# Patient Record
Sex: Female | Born: 1985 | Race: White | Hispanic: Yes | Marital: Married | Smoking: Never smoker
Health system: Southern US, Community
[De-identification: ages and names within clinical notes are randomized; demographics above are authoritative.]

## PROBLEM LIST (undated history)

## (undated) ENCOUNTER — Inpatient Hospital Stay (HOSPITAL_COMMUNITY): Payer: Self-pay

## (undated) DIAGNOSIS — O24419 Gestational diabetes mellitus in pregnancy, unspecified control: Secondary | ICD-10-CM

## (undated) HISTORY — PX: HAND SURGERY: SHX662

## (undated) HISTORY — DX: Gestational diabetes mellitus in pregnancy, unspecified control: O24.419

---

## 2008-11-11 ENCOUNTER — Emergency Department (HOSPITAL_COMMUNITY): Admission: EM | Admit: 2008-11-11 | Discharge: 2008-11-11 | Payer: Self-pay | Admitting: Emergency Medicine

## 2010-08-07 ENCOUNTER — Inpatient Hospital Stay (INDEPENDENT_AMBULATORY_CARE_PROVIDER_SITE_OTHER)
Admission: RE | Admit: 2010-08-07 | Discharge: 2010-08-07 | Disposition: A | Discharge status: Home / Self Care | Payer: Worker's Compensation | Source: Ambulatory Visit | Attending: Family Medicine | Admitting: Family Medicine

## 2010-08-07 DIAGNOSIS — T148XXA Other injury of unspecified body region, initial encounter: Secondary | ICD-10-CM

## 2014-03-12 NOTE — L&D Delivery Note (Signed)
Delivery Note Just prior to birth, her water broke and she immediately felt pressure, Head was crowning when I entered room (was in another delivery).  At 2:55 PM a viable and healthy female was delivered via Vaginal, Spontaneous Delivery (Presentation: ;  ).  Body delivered rapidly.  No difficulty with shoulders. APGAR: 9, 9; weight  .   Placenta status: Intact, Spontaneous.  Cord: 3 vessels with the following complications: None.    Dr Debroah LoopArnold arrived for delivery of placenta and suturing of laceration.  Anesthesia: Local  Episiotomy:  none Lacerations: 2nd degree Suture Repair: 3.0 vicryl repaired by Dr Delanna AhmadiArnold Est. Blood Loss (mL): 700  Mom to postpartum.  Baby to Couplet care / Skin to Skin.  [facility]ILLI[facility],Elizabeth [name] 02/25/2015, 3:55 PM

## 2014-05-22 ENCOUNTER — Emergency Department (INDEPENDENT_AMBULATORY_CARE_PROVIDER_SITE_OTHER)
Admission: EM | Admit: 2014-05-22 | Discharge: 2014-05-22 | Disposition: A | Discharge status: Home / Self Care | Payer: Worker's Compensation | Source: Home / Self Care | Attending: Family Medicine | Admitting: Family Medicine

## 2014-05-22 ENCOUNTER — Encounter (HOSPITAL_COMMUNITY): Payer: Self-pay | Admitting: Emergency Medicine

## 2014-05-22 DIAGNOSIS — M545 Low back pain, unspecified: Secondary | ICD-10-CM

## 2014-05-22 LAB — POCT URINALYSIS DIP (DEVICE)
BILIRUBIN URINE: NEGATIVE
Glucose, UA: NEGATIVE mg/dL
Ketones, ur: NEGATIVE mg/dL
NITRITE: NEGATIVE
Protein, ur: NEGATIVE mg/dL
SPECIFIC GRAVITY, URINE: 1.025 (ref 1.005–1.030)
Urobilinogen, UA: 0.2 mg/dL (ref 0.0–1.0)
pH: 7.5 (ref 5.0–8.0)

## 2014-05-22 LAB — POCT PREGNANCY, URINE: PREG TEST UR: NEGATIVE

## 2014-05-22 MED ORDER — DICLOFENAC SODIUM 50 MG PO TBEC: 50.0000 mg | DELAYED_RELEASE_TABLET | Freq: Two times a day (BID) | ORAL | Status: DC | PRN

## 2014-05-22 MED ORDER — CYCLOBENZAPRINE HCL 5 MG PO TABS: 5.0000 mg | ORAL_TABLET | Freq: Every evening | ORAL | Status: DC | PRN

## 2014-05-22 NOTE — Discharge Instructions (Signed)
Thank you for coming in today.  Dolor de espalda en el adulto (Back Pain, Adult)  El dolor de cintura es frecuente. Aproximadamente 1 de cada 5 personas lo sufren.La causa rara vez pone en peligro la vida. Con frecuencia mejora luego de algn tiempo.Alrededor de la mitad de las personas que sufren un inicio sbito de dolor de cintura, se sentirn mejor luego de 2 semanas. Aproximadamente 8 de cada 10 se sentirn mejor luego de 6 semanas.  CAUSAS  Algunas causas comunes son:   Distensin de los msculos o ligamentos que sostienen la columna vertebral.  Desgaste (degeneracin) de los discos vertebrales.  Artritis.  Traumatismos directos en la espalda. DIAGNSTICO  La mayor parte de las veces, la causa directa no se conoce.Sin embargo, Chief Technology Officerel dolor puede tratarse efectivamente an cuando no se Best boyconozca la causa.Una de las formas ms precisas de asegurar que la causa del dolor no constituye un peligro es responder a las preguntas del mdico acerca de su salud y sus sntomas. Si el mdico necesita ms informacin, podr indicar anlisis de laboratorio o Education officer, environmentalrealizar un diagnstico por imgenes (radiografas o Health visitorresonancia magntica).Sin embargo, aunque las Hovnanian Enterprisesimgenes muestren modificaciones, generalmente no es necesaria la Azerbaijanciruga.  INSTRUCCIONES PARA EL CUIDADO EN EL HOGAR  En algunas personas, el dolor de espalda vuelve.Como rara vez es peligroso, los pacientes pueden aprender a Interior and spatial designermanejarlo ellos mismos.   Mantngase activo. Si permanece sentado o de pie mucho tiempo en el mismo lugar, se tensiona la espalda.  No se siente, maneje ni se quede parado en un mismo lugar por ms de 30 minutos. Realice caminatas cortas en superficies planas ni bien el dolor haya cedido. Trate de Copyaumentar cada da el tiempo que camina .  No se quede en la cama.Si hace reposo durante ms de 1 o 2 das, puede Estate agentretrasar la recuperacin.  No evite los ejercicios ni el trabajo.El cuerpo est hecho para moverse.No es peligroso  estar Broctonactivo, aunque le duela la espalda.La espalda se curar ms rpido si contina sus actividades antes de que el dolor se vaya.  Preste atencin a su cuerpo cuando se incline y se levante. Muchas personas sienten menos molestias cuando levantan objetos si doblan las rodillas, mantienen la carga cerca del cuerpo y evitan torcerse. Generalmente, las posiciones ms cmodas son las que ejercen menos tensin en la espalda en recuperacin.  Encuentre una posicin cmoda para dormir. Utilice un colchn firme y recustese de Franklincostado. Doble ligeramente sus rodillas. Si se recuesta sobre su espalda, coloque una almohada debajo de sus rodillas.  Tome slo medicamentos de venta libre o recetados, segn las indicaciones del mdico. Los medicamentos de venta libre para Primary school teachercalmar el dolor y reducir Futures traderla inflamacin, son los que en general ms ayudan.El mdico podr prescribirle relajantes musculares.Estos medicamentos calman el dolor de modo que pueda retornar a sus actividades normales y a Investment banker, operationalrealizar ejercicios saludables.  Aplique hielo sobre la zona lesionada.  Ponga el hielo en una bolsa plstica.  Colquese una toalla entre la piel y la bolsa de hielo.  Deje la bolsa de hielo durante 15 a 20 minutos 3 a 4 veces por da, durante los primeros 2  3 das. Luego podr alternar Eusebio Meentre calor y hielo para reducir Chief Technology Officerel dolor y los espasmos.  Consulte a su mdico si puede tratar de hacer ejercicios para la espalda y recibir un masaje suave. Pueden ser beneficiosos.  Evite sentirse ansioso o estresado.El estrs aumenta la tensin muscular y puede empeorar el dolor de espalda.Es importante reconocer cuando est  ansioso o estresado y aprender la forma de controlarlos.El ejercicio es una gran opcin. SOLICITE ATENCIN MDICA SI:   Siente un dolor que no se alivia con reposo o medicamentos.  El dolor no mejora en 1 semana.  Desarrolla nuevos sntomas.  No se siente bien en general. SOLICITE ATENCIN MDICA DE  INMEDIATO SI:  Siente un dolor que se irradia desde la espalda hacia sus piernas.  Desarrolla nuevos problemas en el intestino o la vejiga.  Siente debilidad o adormecimiento inusual en sus brazos o piernas.  Presenta nuseas o vmitos.  Presenta dolor abdominal.  Se siente desfalleciente. Document Released: 02/26/2005 Document Revised: 08/28/2011 [facility] Patient Information 2015 [place], [place]. This information is not intended to replace advice given to you by your health care provider. Make sure you discuss any questions you have with your health care provider.

## 2014-05-22 NOTE — ED Notes (Signed)
C/o right lower back pain onset 4 days Pain increases w/activity  Denies inj/trauma but recalls lifting a heavy object at work Also denies urinary sx, fevers, chills, abd pain Alert, no signs of acute distress.

## 2014-05-22 NOTE — ED Provider Notes (Signed)
Elizabeth [name] is a 29 y.o. female who presents to Urgent Care today for right low back pain. Patient suffered right low back pain at work on March ninth while lifting a heavy object. Pain worsened yesterday. She denies any radiating pain weakness or numbness. The pain is worse with activity and better with rest. Pain is moderate. Patient has tried naproxen which helps some. No bowel or bladder problems or difficulty walking. No significant urinary symptoms.   History reviewed. No pertinent past medical history. History reviewed. No pertinent past surgical history. History  Substance Use Topics  . Smoking status: Never Smoker   . Smokeless tobacco: Not on file  . Alcohol Use: Yes   ROS as above Medications: No current facility-administered medications for this encounter.   Current Outpatient Prescriptions  Medication Sig Dispense Refill  . cyclobenzaprine (FLEXERIL) 5 MG tablet Take 1 tablet (5 mg total) by mouth at bedtime as needed for muscle spasms. spanish 20 tablet 0  . diclofenac (VOLTAREN) 50 MG EC tablet Take 1 tablet (50 mg total) by mouth 2 (two) times daily as needed. spanish 30 tablet 0   No Known Allergies   Exam:  BP 116/78 mmHg  Pulse 66  Temp(Src) 98.9 F (37.2 C) (Oral)  Resp 16  SpO2 100%  LMP 05/20/2014 Gen: Well NAD HEENT: EOMI,  MMM Lungs: Normal work of breathing. CTABL Heart: RRR no MRG Abd: NABS, Soft. Nondistended, Nontender Exts: Brisk capillary refill, warm and well perfused.  Back: Nontender to spinal midline. Tender right SI joint. Negative straight leg raise testing contralateral straight leg raise test Mildly positive right Faber test negative left Low back range of motion is normal for flexion but abnormal for extension due to pain Bilateral lower extremity reflexes are equal and normal. Strength is intact bilaterally. As is sensation. Normal gait  Results for orders placed or performed during the hospital encounter of 05/22/14  (from the past 24 hour(s))  POCT urinalysis dip (device)     Status: Abnormal   Collection Time: 05/22/14  4:15 PM  Result Value Ref Range   Glucose, UA NEGATIVE NEGATIVE mg/dL   Bilirubin Urine NEGATIVE NEGATIVE   Ketones, ur NEGATIVE NEGATIVE mg/dL   Specific Gravity, Urine 1.025 1.005 - 1.030   Hgb urine dipstick MODERATE (A) NEGATIVE   pH 7.5 5.0 - 8.0   Protein, ur NEGATIVE NEGATIVE mg/dL   Urobilinogen, UA 0.2 0.0 - 1.0 mg/dL   Nitrite NEGATIVE NEGATIVE   Leukocytes, UA SMALL (A) NEGATIVE  Pregnancy, urine POC     Status: None   Collection Time: 05/22/14  4:19 PM  Result Value Ref Range   Preg Test, Ur NEGATIVE NEGATIVE   No results found.  Assessment and Plan: 29 y.o. female with lumbago due to myofascial disruption. Treat with diclofenac and Flexeril. Work note provided. Urine culture pending.  Discussed warning signs or symptoms. Please see discharge instructions. Patient expresses understanding.     Rodolph BongEvan S Lupie Sawa, MD 05/22/14 [phone]

## 2014-05-24 ENCOUNTER — Emergency Department (INDEPENDENT_AMBULATORY_CARE_PROVIDER_SITE_OTHER)
Admission: EM | Admit: 2014-05-24 | Discharge: 2014-05-24 | Disposition: A | Discharge status: Home / Self Care | Payer: Worker's Compensation | Source: Home / Self Care | Attending: Family Medicine | Admitting: Family Medicine

## 2014-05-24 ENCOUNTER — Encounter (HOSPITAL_COMMUNITY): Payer: Self-pay

## 2014-05-24 DIAGNOSIS — M545 Low back pain, unspecified: Secondary | ICD-10-CM

## 2014-05-24 LAB — POCT URINALYSIS DIP (DEVICE)
Bilirubin Urine: NEGATIVE
GLUCOSE, UA: NEGATIVE mg/dL
Hgb urine dipstick: NEGATIVE
Ketones, ur: NEGATIVE mg/dL
Leukocytes, UA: NEGATIVE
NITRITE: NEGATIVE
Protein, ur: NEGATIVE mg/dL
Specific Gravity, Urine: 1.02 (ref 1.005–1.030)
UROBILINOGEN UA: 0.2 mg/dL (ref 0.0–1.0)
pH: 7 (ref 5.0–8.0)

## 2014-05-24 NOTE — ED Provider Notes (Signed)
CSN: [id number]     Arrival date & time 05/24/14  1236 History   First MD Initiated Contact with Patient 05/24/14 1415     Chief Complaint  Patient presents with  . Back Pain   (Consider location/radiation/quality/duration/timing/severity/associated sxs/prior Treatment) HPI Comments: [name] is a 29 y.o. female who presents to Urgent Care today for right lower back pain. Patient suffered right low back pain at work on March 9th while lifting a heavy object. Works at [employer] and was scheduled top work today and was still uncomfortable today. Decided not to go to work and comes requesting note providing her additional time off. She denies any radiating pain weakness or numbness. No bowel or bladder issues. The pain is worse with activity and better with rest. Pain is moderate. Patient has tried naproxen which helps some. No bowel or bladder problems or difficulty walking. No significant urinary symptoms. Reports improvement with voltaren and flexril [facility]  Patient is a 29 y.o. female presenting with back pain. The history is provided by the patient.  Back Pain   History reviewed. No pertinent past medical history. History reviewed. No pertinent past surgical history. History reviewed. No pertinent family history. History  Substance Use Topics  . Smoking status: Never Smoker   . Smokeless tobacco: Not on file  . Alcohol Use: Yes   OB History    No data available     Review of Systems  Musculoskeletal: Positive for back pain.  All other systems reviewed and are negative.   Allergies  Review of patient's allergies indicates no known allergies.  Home Medications   Prior to Admission medications   Medication Sig Start Date End Date Taking? Authorizing Provider  cyclobenzaprine (FLEXERIL) 5 MG tablet Take 1 tablet (5 mg total) by mouth at bedtime as needed for muscle spasms. spanish 05/22/14   Rodolph BongEvan S Corey, MD  diclofenac (VOLTAREN) 50  MG EC tablet Take 1 tablet (50 mg total) by mouth 2 (two) times daily as needed. spanish 05/22/14   Rodolph BongEvan S Corey, MD   BP 118/73 mmHg  Pulse 70  Temp(Src) 98.3 F (36.8 C) (Oral)  Resp 14  SpO2 100%  LMP 05/20/2014 Physical Exam  Constitutional: She is oriented to person, place, and time. She appears well-developed and well-nourished.  HENT:  Head: Normocephalic and atraumatic.  Cardiovascular: Normal rate, regular rhythm and normal heart sounds.   Pulmonary/Chest: Effort normal and breath sounds normal.  Musculoskeletal: Normal range of motion.       Back:  CSM exam of B/L LE's normal.  Neurological: She is alert and oriented to person, place, and time. She has normal strength. No sensory deficit. Coordination and gait normal.  Reflex Scores:      Patellar reflexes are 2+ on the right side and 2+ on the left side. Skin: Skin is warm and dry.  Psychiatric: She has a normal mood and affect. Her behavior is normal.  Nursing note and vitals reviewed.   ED Course  Procedures (including critical care time) Labs Review Labs Reviewed  URINE CULTURE  POCT URINALYSIS DIP (DEVICE)    Imaging Review No results found.   MDM   1. Right-sided low back pain without sciatica   Continue medication as prescribed. Return to work tomorrow. If continued discomfort, [facility]. Exam without evidence of neuromuscular deficit. Spanish translator utilized for visit. UA normal. UPT on 05/22/2014 negative. Not  repeated for today's visit.    Elizabeth Clock, PA 05/24/14 1547  Elizabeth [name], [place] 05/24/14 [phone]

## 2014-05-24 NOTE — ED Notes (Signed)
patient c/o continued pain in her lower back seen 3-12

## 2014-05-25 LAB — URINE CULTURE
Colony Count: NO GROWTH
Culture: NO GROWTH
Special Requests: NORMAL

## 2014-08-20 ENCOUNTER — Other Ambulatory Visit: Payer: Self-pay

## 2014-08-20 DIAGNOSIS — Z3401 Encounter for supervision of normal first pregnancy, first trimester: Secondary | ICD-10-CM

## 2014-08-20 NOTE — Progress Notes (Signed)
New ob labs done today, No urine culture collected pt unable to void at this visit [facility] Elizabeth [name]

## 2014-08-20 NOTE — Progress Notes (Signed)
Patient unable to void for OB urine culture.Elizabeth [name], Rodena Medin

## 2014-08-21 LAB — HIV ANTIBODY (ROUTINE TESTING W REFLEX): HIV 1&2 Ab, 4th Generation: NONREACTIVE

## 2014-08-21 LAB — SICKLE CELL SCREEN: SICKLE CELL SCREEN: NEGATIVE

## 2014-08-23 LAB — OBSTETRIC PANEL
Antibody Screen: NEGATIVE
BASOS ABS: 0 10*3/uL (ref 0.0–0.1)
Basophils Relative: 0 % (ref 0–1)
Eosinophils Absolute: 0.1 10*3/uL (ref 0.0–0.7)
Eosinophils Relative: 1 % (ref 0–5)
HCT: 37.3 % (ref 36.0–46.0)
HEMOGLOBIN: 13.3 g/dL (ref 12.0–15.0)
Hepatitis B Surface Ag: NEGATIVE
Lymphocytes Relative: 21 % (ref 12–46)
Lymphs Abs: 1.7 10*3/uL (ref 0.7–4.0)
MCH: 30.6 pg (ref 26.0–34.0)
MCHC: 35.7 g/dL (ref 30.0–36.0)
MCV: 85.7 fL (ref 78.0–100.0)
MPV: 9.8 fL (ref 8.6–12.4)
Monocytes Absolute: 0.5 10*3/uL (ref 0.1–1.0)
Monocytes Relative: 6 % (ref 3–12)
NEUTROS PCT: 72 % (ref 43–77)
Neutro Abs: 5.8 10*3/uL (ref 1.7–7.7)
Platelets: 278 10*3/uL (ref 150–400)
RBC: 4.35 MIL/uL (ref 3.87–5.11)
RDW: 13.8 % (ref 11.5–15.5)
RH TYPE: POSITIVE
Rubella: 9.23 Index — ABNORMAL HIGH (ref <0.90)
WBC: 8 10*3/uL (ref 4.0–10.5)

## 2014-08-27 ENCOUNTER — Encounter: Payer: Self-pay | Admitting: Family Medicine

## 2014-08-27 ENCOUNTER — Ambulatory Visit (INDEPENDENT_AMBULATORY_CARE_PROVIDER_SITE_OTHER): Payer: Medicaid Other | Admitting: Family Medicine

## 2014-08-27 VITALS — BP 120/95 | HR 85 | Temp 99.1°F | Wt 125.0 lb

## 2014-08-27 DIAGNOSIS — Z3402 Encounter for supervision of normal first pregnancy, second trimester: Secondary | ICD-10-CM

## 2014-08-27 DIAGNOSIS — Z3492 Encounter for supervision of normal pregnancy, unspecified, second trimester: Secondary | ICD-10-CM

## 2014-08-27 DIAGNOSIS — O0990 Supervision of high risk pregnancy, unspecified, unspecified trimester: Secondary | ICD-10-CM | POA: Insufficient documentation

## 2014-08-27 DIAGNOSIS — Z331 Pregnant state, incidental: Secondary | ICD-10-CM

## 2014-08-27 LAB — POCT URINALYSIS DIPSTICK
Bilirubin, UA: NEGATIVE
Blood, UA: NEGATIVE
GLUCOSE UA: 100
Ketones, UA: NEGATIVE
NITRITE UA: NEGATIVE
Protein, UA: NEGATIVE
Spec Grav, UA: 1.025
Urobilinogen, UA: 0.2
pH, UA: 7

## 2014-08-27 LAB — OB RESULTS CONSOLE GC/CHLAMYDIA: GC PROBE AMP, GENITAL: NEGATIVE

## 2014-08-27 MED ORDER — GNP PRENATAL VITAMINS 28-0.8 MG PO TABS: 1.0000 | ORAL_TABLET | Freq: Every day | ORAL | Status: DC

## 2014-08-27 NOTE — Patient Instructions (Signed)
Primer trimestre de embarazo (First Trimester of Pregnancy) El primer trimestre de embarazo se extiende desde la semana1 hasta el final de la semana12 (mes1 al mes3). Durante este tiempo, el beb comenzar a desarrollarse dentro suyo. Entre la semana6 y la8, se forman los ojos y el rostro, y los latidos del corazn pueden verse en la ecografa. Al final de las 12semanas, todos los rganos del beb estn formados. La atencin prenatal es toda la asistencia mdica que usted recibe antes del nacimiento del beb. Asegrese de recibir una buena atencin prenatal y de seguir todas las indicaciones del mdico. CUIDADOS EN EL HOGAR  Medicamentos:  Tome los medicamentos solamente como se lo haya indicado el mdico. Algunos medicamentos se pueden tomar durante el embarazo y otros no.  Tome las vitaminas prenatales como se lo haya indicado el mdico.  Tome el medicamento que la ayuda a defecar (laxante suave) segn sea necesario, si el mdico lo autoriza. Dieta  Ingiera alimentos saludables de manera regular.  El mdico le indicar la cantidad de peso que puede aumentar.  No coma carne cruda ni quesos sin cocinar.  Si tiene malestar estomacal (nuseas) o vomita:  Ingiera 4 o 5comidas pequeas por da en lugar de 3abundantes.  Intente comer algunas galletitas saladas.  Beba lquidos entre las comidas, en lugar de hacerlo durante estas.  Si tiene dificultad para defecar (estreimiento):  Consuma alimentos con alto contenido de fibra, como verduras y frutas frescos, y cereales integrales.  Beba suficiente lquido para mantener el pis (orina) claro o de color amarillo plido. Actividad y ejercicios  Haga ejercicios solamente como se lo haya indicado el mdico. Deje de hacer ejercicios si tiene clicos o dolor en la parte baja del vientre (abdomen) o en la cintura.  Intente no estar de pie durante mucho tiempo. Mueva las piernas con frecuencia si debe estar de pie en un lugar durante  mucho tiempo.  Evite levantar pesos excesivos.  Use zapatos con tacones bajos. Mantenga una buena postura al sentarse y pararse.  Puede tener relaciones sexuales, a menos que el mdico le indique lo contrario. Alivio del dolor o las molestias  Use un sostn que le brinde buen soporte si le duelen las mamas.  Dese baos con agua tibia (baos de asiento) para aliviar el dolor o las molestias a causa de las hemorroides. Use crema antihemorroidal si el mdico se lo permite.  Descanse con las piernas elevadas si tiene calambres o dolor de cintura.  Use medias de descanso si tiene las venas de las piernas hinchadas y abultadas (venas varicosas). Eleve los pies durante 15minutos, 3 o 4veces por da. Limite la cantidad de sal en su dieta. Cuidados prenatales  Programe las visitas prenatales para la semana12 de embarazo.  Escriba sus preguntas. Llvelas cuando concurra a las visitas prenatales.  Concurra a todas las visitas prenatales como se lo haya indicado el mdico. Seguridad  Colquese el cinturn de seguridad cuando conduzca.  Haga una lista con los nmeros de telfono en caso de emergencia, en la cual deben incluirse los nmeros de los familiares, los amigos, el hospital y los departamentos de polica y de bomberos. Consejos generales  Pdale al mdico que la derive a clases prenatales en su localidad. Debe comenzar a tomar las clases antes de entrar en el mes6 de embarazo.  Pida ayuda si necesita asesoramiento o asistencia con la alimentacin. El mdico puede aconsejarla o indicarle dnde recurrir para recibir ayuda.  No se d baos de inmersin en agua caliente,   baos turcos ni saunas.  No se haga duchas vaginales ni use tampones o toallas higinicas perfumadas.  No mantenga las piernas cruzadas durante [place].  Evite el contacto con las bandejas sanitarias de los gatos y la tierra que estos animales usan.  No fume, no consuma hierbas ni beba alcohol. No tome  frmacos que el mdico no haya autorizado.  Visite al dentista. En su casa, lvese los dientes con un cepillo dental suave. Psese el hilo dental con suavidad. SOLICITE AYUDA SI:  Tiene mareos.  Tiene clicos leves o siente presin en la parte baja del vientre.  Siente un dolor persistente en la zona del vientre.  Sigue teniendo [employer], vomita o las heces son lquidas (diarrea).  Observa una secrecin, con mal olor que proviene de la vagina.  Siente dolor al [employer].  Tiene el rostro, las [place], las piernas o los tobillos ms hinchados (inflamados). SOLICITE AYUDA DE INMEDIATO SI:   Tiene fiebre.  Tiene una prdida de lquido por la vagina.  Tiene sangrado o pequeas prdidas vaginales.  Tiene clicos o dolor muy intensos en el vientre.  Sube o baja de peso rpidamente.  Vomita sangre. Puede ser similar a la borra del caf  Est en contacto con personas que tienen rubola, la quinta enfermedad o varicela.  Siente un dolor de cabeza muy intenso.  Le falta el aire.  Sufre cualquier tipo de traumatismo, por ejemplo, debido a una cada o un accidente automovilstico. Document Released: 05/25/2008 Document Revised: 07/13/2013 [facility] Patient Information 2015 [place], [place]. This information is not intended to replace advice given to you by your health care provider. Make sure you discuss any questions you have with your health care provider.  Alfafetoprotena (AFP) materna (AFP Maternal) Es un estudio de rutina (anlisis) que se [place] para Optician, dispensing, como el sndrome de Down y las anomalas congnitas del tubo neural. El sndrome de Down es una anomala cromosmica, a veces llamada trisoma21. Las anomalas congnitas del tubo neural son defectos congnitos graves en las cuales el cerebro, la mdula espinal o sus membranas no se desarrollan normalmente. Las mujeres deben someterse a los estudios entre la semana 15 y la 20de gestacin. El estudio de  alfafetoprotena en suero materno incluye tres o cuatro anlisis que miden las sustancias que hay en la [place], lo que mejora su precisin. Durante el desarrollo, las concentraciones de AFP en la sangre del feto y el lquido amnitico [place], aproximadamente hasta la [place], y luego bajan gradualmente hasta el momento del nacimiento. La AFP es una protena producida por el tejido fetal que atraviesa la placenta y llega a la sangre materna. Un beb con una anomala congnita del tubo neural tiene una abertura en la columna vertebral, la cabeza o la pared abdominal que permite el traspaso de cantidades de AFP ms altas de lo normal a la [employer]. Si el resultado del estudio es positivo, se deben hacer ms pruebas para llegar a un diagnstico, entre las que se incluyen ecografas y, tal vez, amniocentesis (estudio del lquido que rodea al beb). Estos estudios se usan para ayudar a Architectural technologist y a sus mdicos a Psychologist, counselling de los embarazos. En los embarazos en los que el feto tiene el defecto cromosmico que da origen al sndrome de Down, las concentraciones de AFP y estriol no conjugado suelen ser [place], y las concentraciones de hCG e inhibinaA son elevadas.  PREPARACIN PARA EL ESTUDIO Se extrae sangre de una vena del brazo, generalmente entre  la semana 15 y la 20de gestacin. Se realizan cuatro [employer] de la sangre, a saber, AFP, hCG, estriol no conjugado e inhibinaA. La combinacin de anlisis produce un resultado ms exacto. HALLAZGOS NORMALES   Adultos: menos de /ml o menos de /l (unidades SI)  Nios menores de 1ao: menos de /ml Los rangos se estratifican por semanas de gestacin y varan entre los laboratorios. Los rangos para los resultados normales pueden variar entre diferentes laboratorios y hospitales. Consulte siempre con su mdico despus de Production assistant, radio estudio para Artist significado de los [place] y si los valores se consideran "dentro  de los lmites normales". SIGNIFICADO DEL ESTUDIO  Estos son estudios de deteccin sistemtica. No todos los [employer] de los estudios confirmarn la presencia de Ship broker. De todas las mujeres con resultados positivos en los estudios de deteccin de AFP, solo un pequeo nmero tiene bebs que realmente tienen el defecto congnito del tubo neural o anomalas cromosmicas. El mdico [employer] y Heritage manager con usted sobre la importancia y el significado de los [place], as como de las opciones de tratamiento y la necesidad de Education officer, environmental pruebas adicionales, si fuera necesario. OBTENCIN DE LOS RESULTADOS DE LAS PRUEBAS Es su responsabilidad retirar el resultado del [place]. Consulte en el laboratorio cundo y cmo podr [employer]. Document Released: 12/17/2012 [facility] Patient Information 2015 [place], [place]. This information is not intended to replace advice given to you by your health care provider. Make sure you discuss any questions you have with your health care provider.  Soft Cheeses: Imported soft cheeses may contain listeria.  You would need to avoid soft cheeses such as: brie, Camembert, Roquefort, feta, Gorgonzola, and [place] style cheeses that include queso blanco and [employer], unless they clearly state that they are made from pasteurized milk. All soft non-imported cheeses made with pasteurized milk are safe to eat.  The majority of seafood-borne illness is caused by undercooked shellfish, which include oysters, clams, and mussels. Cooking helps prevent some types of infection, but it does not prevent the algae-related infections that are associated with red tides. Raw shellfish pose a concern for everybody, and they should be avoided altogether during pregnancy.  Avoid fish from contaminated lakes and rivers that may be exposed to high levels of polychlorinated biphenyls. This is primarily for those who fish in local lakes and streams. These  fish include: bluefish, striped bass, salmon, pike, trout, and walleye. Contact the local health department or Environmental Protection Agency to determine which fish are safe to eat in your area. Remember, this is regarding fish caught in local waters and not fish from your local grocery store.  Smoked Seafood -Refrigerated, smoked seafood often labeled as lox, nova style, kippered, or jerky should be avoided because it could be contaminated with listeria. (These are safe to eat when they are in an ingredient in a meal that has been cooked, like a casserole.) This type of fish is often found in the deli section of your grocery store. Canned or shelf-safe smoked seafood is usually fine to eat.  Deli Meat: Deli meats have been known to be contaminated with listeria, which can cause miscarriage. Listeria has the ability to cross the placenta and may infect the baby, which could lead to infection or blood poisoning and may be life-threatening. If you are pregnant and you are considering eating deli meats, make certain that you reheat the meat until it is steaming .  Raw Meat: Uncooked seafood and rare or undercooked  beef or poultry should be avoided because of the risk of contamination with coliform bacteria, toxoplasmosis, and salmonella.  Unpasteurized Milk: Unpasteurized milk may contain listeria. Make sure that any milk you drink is pasteurized.

## 2014-08-27 NOTE — Progress Notes (Signed)
Elizabeth [name] Elizabeth [name] is a 29 y.o. yo G1P0 at [redacted]w[redacted]d who presents for her initial prenatal visit. Pregnancy  isplanned She reports No N/V, VB, LOF. She  isTaking PNV. See flow sheet for details.  PMH, POBH, FH, meds, allergies and Social Hx reviewed.  Prenatal exam:Gen: Well nourished, well developed.  No distress.  Vitals noted. HEENT: Normocephalic, atraumatic.  Neck supple without cervical lymphadenopathy, thyromegaly or thyroid nodules.  fair dentition. CV: RRR no murmur, gallops or rubs Lungs: CTA B.  Normal respiratory effort without wheezes or rales. Abd: soft, NTND. +BS.  Uterus not appreciated above pelvis. Ext: No clubbing, cyanosis or edema. Psych: Normal grooming and dress.  Not depressed or anxious appearing.  Normal thought content and process without flight of ideas or looseness of associations  Assessment/plan: 1) Pregnancy [redacted]w[redacted]d doing well.  Current pregnancy issues include: None Dating is reliable Prenatal labs reviewed, notable for no problems. Bleeding and pain precautions reviewed. Importance of prenatal vitamins reviewed.  Genetic screening offered: She is considered Early glucola is not indicated.    Follow up 4 weeks.

## 2014-08-28 LAB — URINE CULTURE
Colony Count: NO GROWTH
Organism ID, Bacteria: NO GROWTH

## 2014-08-31 ENCOUNTER — Ambulatory Visit: Payer: Self-pay

## 2014-08-31 LAB — URINE CYTOLOGY ANCILLARY ONLY
Chlamydia: NEGATIVE
Neisseria Gonorrhea: NEGATIVE

## 2014-09-27 ENCOUNTER — Ambulatory Visit (INDEPENDENT_AMBULATORY_CARE_PROVIDER_SITE_OTHER): Payer: Self-pay | Admitting: Family Medicine

## 2014-09-27 ENCOUNTER — Telehealth: Payer: Self-pay

## 2014-09-27 VITALS — BP 117/66 | HR 89 | Temp 98.9°F | Wt 127.2 lb

## 2014-09-27 DIAGNOSIS — Z3402 Encounter for supervision of normal first pregnancy, second trimester: Secondary | ICD-10-CM

## 2014-09-27 NOTE — Telephone Encounter (Signed)
LVM for pt to call.  Need to inform her of an ultra sound appt scheduled for 10/05/2014 @ 9:30 am[facility]  Need to bring insurance card, photo ID. No children under the age of 27. Sunday SpillersSharon T Saunders, CMA

## 2014-09-27 NOTE — Patient Instructions (Signed)
Dolor abdominal en el embarazo (Abdominal Pain During Pregnancy) El dolor abdominal es frecuente durante el embarazo. Generalmente no causa ningn dao. El dolor abdominal puede tener numerosas causas. Algunas causas son ms graves que otras. Ciertas causas de dolor abdominal durante el embarazo se diagnostican fcilmente. A veces, se tarda un tiempo para llegar al diagnstico. Otras veces la causa no se conoce. El dolor abdominal puede estar relacionado con [place]alguna alteracin del [place]embarazo, o puede deberse a una causa totalmente diferente. Por este motivo, siempre consulte a su mdico cuando sienta molestias abdominales. INSTRUCCIONES PARA EL CUIDADO EN EL HOGAR  Est atenta al dolor para ver si hay cambios. Las siguientes indicaciones ayudarn a Psychologist, educationalaliviar cualquier Longs Drug Storesmolestia que pueda sentir:  No Chiropodisttenga relaciones sexuales y no coloque nada dentro de la vagina hasta que los sntomas hayan desaparecido completamente.  Descanse todo lo que pueda RadioShackhasta que el dolor se le haya calmado.  Si siente nuseas, beba lquidos claros. Evite los alimentos slidos mientras sienta malestar o tenga nuseas.  Tome slo medicamentos de venta libre o recetados, segn las indicaciones del mdico.  Cumpla con todas las visitas de control, segn le indique su mdico. SOLICITE ATENCIN MDICA DE INMEDIATO SI:  Tiene un sangrado, prdida de lquidos o elimina tejidos por la vagina.  El dolor o los clicos Brunoaumentan.  Tiene vmitos persistentes.  Comienza a Financial risk analystsentir dolor al orinar u Centex Corporationobserva sangre.  Tiene fiebre.  Nota que los movimientos del beb disminuyen.  Siente intensa debilidad o se marea.  Tiene dificultad para respirar con o sin dolor abdominal.  Siente un dolor de cabeza intenso junto al dolor abdominal.  Shelle Ironiene una secrecin vaginal anormal con dolor abdominal.  Tiene diarrea persistente.  El dolor abdominal sigue o empeora an despus de Field seismologisthacer reposo. ASEGRESE DE QUE:   Comprende estas  instrucciones.  Controlar su afeccin.  Recibir ayuda de inmediato si no mejora o si empeora. Document Released: 02/26/2005 Document Revised: 12/17/2012 [facility] Patient Information 2015 [place], [place]. This information is not intended to replace advice given to you by your health care provider. Make sure you discuss any questions you have with your health care provider.  Embarazo y sexo  (Pregnancy and Sex) Su vida sexual puede cambiar durante el Psychiatristembarazo y despus de que el beb nace. Es normal tener dudas sobre el sexo durante el [place] Colonyembarazo. Cada mujer se ve afectada de manera diferente por las hormonas del [place]embarazo. Podr notar un aumento o disminucin del deseo sexual durante el Psychiatristembarazo. Adems, la actitud y el deseo sexual de su pareja pueden cambiar. Comparta la informacin de este documento con su pareja. Hable abiertamente acerca de cmo se siente acerca del sexo. CUNDO ES SEGURO TENER RELACIONES SEXUALES DURANTE EL NevadaMBARAZO?  Se considera seguro tener relaciones sexuales si el E. Lopezembarazo es normal y de [place] riesgo. Recuerde:  El feto est protegido por el tero y el saco lleno de lquido que rodea al feto (saco amnitico).  El cuello uterino est cerrado o sellado durante el [place]embarazo.  El pene no alcanza ni daa al feto durante las relaciones sexuales.  El sexo y los orgasmos no causan abortos espontneos ni parto prematuro.  Si necesita un lubricante, use un producto soluble en agua. QU FACTORES DE RIESGO HACEN QUE NO SEA SEGURO TENER SEXO DURANTE EL EMBARAZO? Algunas complicaciones o factores de riesgo pueden hacer necesario limitar la actividad sexual, por ejemplo:  Si tiene un historial de abortos espontneos o Sport and exercise psychologistparto prematuro.  Tiene sangrado, secreciones, prdidas de lquido  o contracciones.  La placenta cubre parcial o completamente la abertura del cuello del tero (placenta previa).  El cuello uterino es dbil y se abre fcilmente (incompetencia cervical).  Su  pareja sufre una enfermedad de transmisin sexual (ETS). Evite tener relaciones sexuales con la persona infectada o use un condn para prevenir la infeccin al feto.  No est seguro de la historia sexual de su pareja. Evite el sexo o use preservativos.  Usted tiene Chartered loss adjuster de gemelos, triples o mltiples. Su mdico le ayudar a Clinical research associate durante el embarazo es seguro o no. QU PRCTICAS SON INSEGURAS?  Si usted Actor oral, debe evitar que su pareja insufle aire dentro de su vagina. Podra formarse una burbuja peligrosa en el torrente sanguneo.  No se recomienda el sexo anal durante el embarazo. Pueden propagarse las bacterias del recto y agravar las hemorroides. Document Released: 11/21/2011 Document Revised: 12/17/2012 [facility] Patient Information 2015 [place], [place]. This information is not intended to replace advice given to you by your health care provider. Make sure you discuss any questions you have with your health care provider.  Segundo trimestre de Psychiatrist (Second Trimester of Pregnancy) El segundo trimestre va desde la semana13 hasta la 28, desde el cuarto hasta el sexto mes, y suele ser el momento en el que mejor se siente. Su organismo se ha adaptado a Charity fundraiser y comienza a Diplomatic Services operational officer. En general, las nuseas matutinas han disminuido o han desaparecido completamente, p El segundo trimestre es tambin la poca en la que el feto se desarrolla rpidamente. Hacia el final del sexto mes, el feto mide aproximadamente 9pulgadas (23cm) y pesa alrededor de 1 libras (700g). Es probable que sienta que el beb se Teacher, English as a foreign language (da pataditas) entre las 18 y 20semanas del Psychiatrist. CAMBIOS EN EL ORGANISMO Su organismo atraviesa por muchos cambios durante el [place], y estos varan de Neomia Dear mujer a Educational psychologist.   Seguir [employer]. Notar que la parte baja del abdomen sobresale.  Podrn aparecer las primeras Albertson's caderas, el abdomen y  las [place].  Es posible que tenga dolores de cabeza que pueden aliviarse con los medicamentos que su mdico autorice.  Tal vez tenga necesidad de orinar con ms frecuencia porque el feto est ejerciendo presin Ambulance person.  Debido al Vanetta Mulders podr sentir Anthoney Harada estomacal con frecuencia.  Puede estar estreida, ya que ciertas hormonas enlentecen los movimientos de los msculos que [employer] desechos a travs de los intestinos.  Pueden aparecer hemorroides o abultarse e hincharse las venas (venas varicosas).  Puede tener dolor de espalda que se debe al Citigroup de peso y a que las hormonas del Management consultant las articulaciones entre los huesos de la pelvis, y Public librarian consecuencia de la modificacin del peso y los msculos que mantienen el equilibrio.  Las ConAgra Foods seguirn creciendo y Development worker, community.  Las Veterinary surgeon y estar sensibles al cepillado y al hilo dental.  Pueden aparecer zonas oscuras o manchas (cloasma, mscara del Psychiatrist) en el rostro que probablemente se atenuarn despus del nacimiento del beb.  Es posible que se forme una lnea oscura desde el ombligo hasta la zona del pubis (linea nigra) que probablemente se atenuarn despus del nacimiento del beb.  Tal vez haya cambios en el cabello que pueden incluir su engrosamiento, crecimiento rpido y cambios en la textura. Adems, a algunas mujeres se les cae el cabello durante o despus del embarazo, o tienen el cabello seco o fino. Lo ms probable  es que el cabello se le normalice despus del nacimiento del beb. QU DEBE ESPERAR EN LAS CONSULTAS PRENATALES Durante una visita prenatal de rutina:  La pesarn para asegurarse de que usted y el feto estn creciendo normalmente.  Le tomarn la presin arterial.  Le medirn el abdomen para controlar el desarrollo del beb.  Se escucharn los latidos cardacos fetales.  Se evaluarn los resultados de los estudios solicitados en visitas anteriores. El mdico puede  preguntarle lo siguiente:  Cmo se siente.  Si siente los movimientos del beb.  Si ha tenido sntomas anormales, como prdida de lquido, [place], dolores de cabeza intensos o clicos abdominales.  Si tiene [employer]. Otros estudios que podrn realizarse durante el segundo trimestre incluyen lo siguiente:  Anlisis de sangre para detectar:  Concentraciones de hierro bajas (anemia).  Diabetes gestacional (entre la semana 24 y la 28).  Anticuerpos Rh.  Anlisis de orina para detectar infecciones, diabetes o protenas en la orina.  Una ecografa para confirmar que el beb crece y se desarrolla correctamente.  Una amniocentesis para diagnosticar posibles problemas genticos.  Estudios del feto para descartar espina bfida y sndrome de Down. INSTRUCCIONES PARA EL CUIDADO EN EL HOGAR   Evite fumar, consumir hierbas, beber alcohol y tomar frmacos que no le hayan recetado. Estas sustancias qumicas afectan la formacin y el desarrollo del beb.  Siga las indicaciones del mdico en relacin con el uso de medicamentos. Durante el embarazo, hay medicamentos que son seguros de tomar y otros que no.  Haga actividad fsica solo en la forma indicada por el mdico. Sentir clicos uterinos es un buen signo para Restaurant manager, fast food actividad fsica.  Contine comiendo alimentos que sanos con regularidad.  Use un sostn que le brinde buen soporte si le [employer].  No se d baos de inmersin en agua caliente, baos turcos ni saunas.  Colquese el cinturn de seguridad cuando conduzca.  No coma carne cruda ni queso sin cocinar; evite el contacto con las bandejas sanitarias de los gatos y la tierra que estos animales usan. Estos elementos contienen grmenes que pueden causar defectos congnitos en el beb.  Tome las vitaminas prenatales.  Si est estreida, pruebe un laxante suave (si el mdico lo autoriza). Consuma ms alimentos ricos en fibra, como vegetales y frutas frescos y  Radiation protection practitioner. Beba gran cantidad de lquido para mantener la orina de tono claro o color amarillo plido.  Dese baos de asiento con agua tibia para Engineer, materials o las molestias causadas por las hemorroides. Use una crema para las hemorroides si el mdico la autoriza.  Si tiene venas varicosas, use medias de descanso. Eleve los pies durante , 3 o 4veces por da. Limite la cantidad de sal en su dieta.  No levante objetos pesados, use zapatos de tacones bajos y [address].  Descanse con las piernas elevadas si tiene calambres o dolor de cintura.  Visite a su dentista si an no lo ha Occupational hygienist. Use un cepillo de dientes blando para higienizarse los dientes y psese el hilo dental con suavidad.  Puede seguir Cal[employer], a menos que el mdico le indique lo contrario.  Concurra a todas las visitas prenatales segn las indicaciones de su mdico. SOLICITE ATENCIN MDICA SI:   [place].  Siente clicos leves, presin en la pelvis o dolor persistente en el abdomen.  Tiene nuseas, vmitos o diarrea persistentes.  Tiene secrecin vaginal con mal olor.  Siente dolor al ConocoPhillips.  SOLICITE ATENCIN MDICA DE INMEDIATO SI:   Tiene fiebre.  Tiene una prdida de lquido por la vagina.  Tiene sangrado o pequeas prdidas vaginales.  Siente dolor intenso o clicos en el abdomen.  Sube o baja de peso rpidamente.  Tiene dificultad para respirar y siente dolor de pecho.  Sbitamente se le hinchan mucho el rostro, las [place], los tobillos, los pies o las piernas.  No ha sentido los movimientos del beb durante Georgianne Fick.  Siente un dolor de cabeza intenso que no se alivia con medicamentos.  Hay cambios en la visin. Document Released: 12/06/2004 Document Revised: 03/03/2013 [facility] Patient Information 2015 [place], [place]. This information is not intended to replace advice given to you by your health care provider.  Make sure you discuss any questions you have with your health care provider.

## 2014-09-27 NOTE — Progress Notes (Signed)
Elizabeth [name] is a 29 y.o. G1P0 at 6193w6d for routine follow up.  She reports vaginal discharge and abdominal tightness.  See flow sheet for details. Denies VB, LOF or CTX. Notes good FM x 3 weeks. Increased vaginal discharge w/o pain, bleeding, itching or dysuria. Mild bilateral abdominal pain that is intermittent. No fevers.   A/P: Pregnancy at 6293w6d.  Doing well.   Pregnancy issues include: Round ligament pain and increased normal vaginal discharge. Patient advised to get belly band if needed. Patient declines pelvic exam or wet prep at this time.  Anatomy ultrasound ordered to be scheduled at 18-19 weeks. - Needs to follow-up to [facility] about financial assistance.  Pt  is not interested in genetic screening. Bleeding and pain precautions reviewed. Follow up 4 weeks.

## 2014-09-29 ENCOUNTER — Telehealth: Payer: Self-pay

## 2014-09-29 NOTE — Telephone Encounter (Signed)
Contacted pt via [employer] ID # [id number]. Informed pt of Ultra Sound appt

## 2014-09-29 NOTE — Telephone Encounter (Signed)
Called pt through [employer].  LM asking Pt to call the office.  Need to notify pt that she needs to turn in paperwork for orange card approval before Ultra Sound can be done.  Ultra sound will be rescheduled after paperwork is received.Elizabeth SpillersSharon T Maleea [name], CMA

## 2014-09-29 NOTE — Telephone Encounter (Signed)
Called pt with help of pacific interpreters [place], [place] [id number] left message for pt about orange card.  Pt needs to turn in paperwork to get coverage.  Will need to reschedule ultra sound.  Sunday SpillersSharon T Saunders, CMA

## 2014-10-01 NOTE — Telephone Encounter (Signed)
Pt informed, she will bring paperwork today. Elizabeth [name], Elizabeth [name]

## 2014-10-01 NOTE — Telephone Encounter (Signed)
Called pt, LM using [employer]. Elizabeth [name], ID number [phone]. If pt calls back give information below. Sunday Spillers, CMA

## 2014-10-05 ENCOUNTER — Ambulatory Visit (HOSPITAL_COMMUNITY): Payer: Self-pay

## 2014-10-07 ENCOUNTER — Encounter: Payer: Self-pay | Admitting: *Deleted

## 2014-10-07 NOTE — Progress Notes (Signed)
Patient came in yesterday requesting an ultrasound appt with Dr.  Elsie Stain office instead of women's due to the cost.  Appt with Dr. Elsie Stain office is scheduled for Friday July29th at 11am.  Patient is already aware to bring $300 cash for this appointment.  Informed of appt by Neomia Dear.  Jazmin Hartsell,CMA

## 2014-10-13 ENCOUNTER — Ambulatory Visit (HOSPITAL_COMMUNITY): Admission: RE | Admit: 2014-10-13 | Payer: Self-pay | Source: Ambulatory Visit

## 2014-10-25 ENCOUNTER — Encounter: Payer: Self-pay | Admitting: Family Medicine

## 2014-10-25 ENCOUNTER — Ambulatory Visit (INDEPENDENT_AMBULATORY_CARE_PROVIDER_SITE_OTHER): Payer: Self-pay | Admitting: Family Medicine

## 2014-10-25 VITALS — BP 99/65 | HR 96 | Temp 97.8°F | Wt 130.4 lb

## 2014-10-25 DIAGNOSIS — N898 Other specified noninflammatory disorders of vagina: Secondary | ICD-10-CM

## 2014-10-25 DIAGNOSIS — O26899 Other specified pregnancy related conditions, unspecified trimester: Secondary | ICD-10-CM

## 2014-10-25 DIAGNOSIS — O26892 Other specified pregnancy related conditions, second trimester: Secondary | ICD-10-CM

## 2014-10-25 DIAGNOSIS — Z3402 Encounter for supervision of normal first pregnancy, second trimester: Secondary | ICD-10-CM

## 2014-10-25 LAB — POCT WET PREP (WET MOUNT): CLUE CELLS WET PREP WHIFF POC: NEGATIVE

## 2014-10-25 NOTE — Progress Notes (Signed)
Elizabeth [name] is a 29 y.o. G1P0 at [redacted]w[redacted]d for routine follow up.  She reports continued white vaginal discharge without odor, pain, itching, or bleeding.  Endorses bilateral abdominal tightness that occurs intermittently when walking or standing radiating to pelvis.  Denies any contractions or loss of fluid.  Continues to note good fetal movement.  Denies fevers or chills; no dysuria.   See flow sheet for details.  A/P: Pregnancy at [redacted]w[redacted]d.  Doing well.   Pregnancy issues include: Round ligament pain; vaginal discharge and odor. Checking wet prep and recommend belly band Anatomy scan reviewed, problems are noted: Incomplete heart disease, and low-lying placenta.  Ordered a repeat ultrasound Preterm labor precautions reviewed. Follow up 4 weeks.

## 2014-10-25 NOTE — Patient Instructions (Signed)
Segundo trimestre de embarazo (Second Trimester of Pregnancy) El segundo trimestre va desde la semana13 hasta la 28, desde el cuarto hasta el sexto mes, y suele ser el momento en el que mejor se siente. Su organismo se ha adaptado a estar embarazada y comienza a sentirse fsicamente mejor. En general, las nuseas matutinas han disminuido o han desaparecido completamente, p El segundo trimestre es tambin la poca en la que el feto se desarrolla rpidamente. Hacia el final del sexto mes, el feto mide aproximadamente 9pulgadas (23cm) y pesa alrededor de 1 libras (700g). Es probable que sienta que el beb se mueve (da pataditas) entre las 18 y 20semanas del embarazo. CAMBIOS EN EL ORGANISMO Su organismo atraviesa por muchos cambios durante el embarazo, y estos varan de una mujer a otra.   Seguir aumentando de peso. Notar que la parte baja del abdomen sobresale.  Podrn aparecer las primeras estras en las caderas, el abdomen y las mamas.  Es posible que tenga dolores de cabeza que pueden aliviarse con los medicamentos que su mdico autorice.  Tal vez tenga necesidad de orinar con ms frecuencia porque el feto est ejerciendo presin sobre la vejiga.  Debido al embarazo podr sentir acidez estomacal con frecuencia.  Puede estar estreida, ya que ciertas hormonas enlentecen los movimientos de los msculos que empujan los desechos a travs de los intestinos.  Pueden aparecer hemorroides o abultarse e hincharse las venas (venas varicosas).  Puede tener dolor de espalda que se debe al aumento de peso y a que las hormonas del embarazo relajan las articulaciones entre los huesos de la pelvis, y como consecuencia de la modificacin del peso y los msculos que mantienen el equilibrio.  Las mamas seguirn creciendo y le dolern.  Las encas pueden sangrar y estar sensibles al cepillado y al hilo dental.  Pueden aparecer zonas oscuras o manchas (cloasma, mscara del embarazo) en el rostro que  probablemente se atenuarn despus del nacimiento del beb.  Es posible que se forme una lnea oscura desde el ombligo hasta la zona del pubis (linea nigra) que probablemente se atenuarn despus del nacimiento del beb.  Tal vez haya cambios en el cabello que pueden incluir su engrosamiento, crecimiento rpido y cambios en la textura. Adems, a algunas mujeres se les cae el cabello durante o despus del embarazo, o tienen el cabello seco o fino. Lo ms probable es que el cabello se le normalice despus del nacimiento del beb. QU DEBE ESPERAR EN LAS CONSULTAS PRENATALES Durante una visita prenatal de rutina:  La pesarn para asegurarse de que usted y el feto estn creciendo normalmente.  Le tomarn la presin arterial.  Le medirn el abdomen para controlar el desarrollo del beb.  Se escucharn los latidos cardacos fetales.  Se evaluarn los resultados de los estudios solicitados en visitas anteriores. El mdico puede preguntarle lo siguiente:  Cmo se siente.  Si siente los movimientos del beb.  Si ha tenido sntomas anormales, como prdida de lquido, sangrado, dolores de cabeza intensos o clicos abdominales.  Si tiene alguna pregunta. Otros estudios que podrn realizarse durante el segundo trimestre incluyen lo siguiente:  Anlisis de sangre para detectar:  Concentraciones de hierro bajas (anemia).  Diabetes gestacional (entre la semana 24 y la 28).  Anticuerpos Rh.  Anlisis de orina para detectar infecciones, diabetes o protenas en la orina.  Una ecografa para confirmar que el beb crece y se desarrolla correctamente.  Una amniocentesis para diagnosticar posibles problemas genticos.  Estudios del feto para descartar espina   bfida y sndrome de Down. INSTRUCCIONES PARA EL CUIDADO EN EL HOGAR   Evite fumar, consumir hierbas, beber alcohol y tomar frmacos que no le hayan recetado. Estas sustancias qumicas afectan la formacin y el desarrollo del beb.  Siga  las indicaciones del mdico en relacin con el uso de medicamentos. Durante el embarazo, hay medicamentos que son seguros de tomar y otros que no.  Haga actividad fsica solo en la forma indicada por el mdico. Sentir clicos uterinos es un buen signo para detener la actividad fsica.  Contine comiendo alimentos que sanos con regularidad.  Use un sostn que le brinde buen soporte si le duelen las mamas.  No se d baos de inmersin en agua caliente, baos turcos ni saunas.  Colquese el cinturn de seguridad cuando conduzca.  No coma carne cruda ni queso sin cocinar; evite el contacto con las bandejas sanitarias de los gatos y la tierra que estos animales usan. Estos elementos contienen grmenes que pueden causar defectos congnitos en el beb.  Tome las vitaminas prenatales.  Si est estreida, pruebe un laxante suave (si el mdico lo autoriza). Consuma ms alimentos ricos en fibra, como vegetales y frutas frescos y cereales integrales. Beba gran cantidad de lquido para mantener la orina de tono claro o color amarillo plido.  Dese baos de asiento con agua tibia para aliviar el dolor o las molestias causadas por las hemorroides. Use una crema para las hemorroides si el mdico la autoriza.  Si tiene venas varicosas, use medias de descanso. Eleve los pies durante 15minutos, 3 o 4veces por da. Limite la cantidad de sal en su dieta.  No levante objetos pesados, use zapatos de tacones bajos y mantenga una buena postura.  Descanse con las piernas elevadas si tiene calambres o dolor de cintura.  Visite a su dentista si an no lo ha hecho durante el embarazo. Use un cepillo de dientes blando para higienizarse los dientes y psese el hilo dental con suavidad.  Puede seguir manteniendo relaciones sexuales, a menos que el mdico le indique lo contrario.  Concurra a todas las visitas prenatales segn las indicaciones de su mdico. SOLICITE ATENCIN MDICA SI:   Tiene mareos.  Siente  clicos leves, presin en la pelvis o dolor persistente en el abdomen.  Tiene nuseas, vmitos o diarrea persistentes.  Tiene secrecin vaginal con mal olor.  Siente dolor al orinar. SOLICITE ATENCIN MDICA DE INMEDIATO SI:   Tiene fiebre.  Tiene una prdida de lquido por la vagina.  Tiene sangrado o pequeas prdidas vaginales.  Siente dolor intenso o clicos en el abdomen.  Sube o baja de peso rpidamente.  Tiene dificultad para respirar y siente dolor de pecho.  Sbitamente se le hinchan mucho el rostro, las manos, los tobillos, los pies o las piernas.  No ha sentido los movimientos del beb durante una hora.  Siente un dolor de cabeza intenso que no se alivia con medicamentos.  Hay cambios en la visin. Document Released: 12/06/2004 Document Revised: 03/03/2013 ExitCare Patient Information 2015 ExitCare, LLC. This information is not intended to replace advice given to you by your health care provider. Make sure you discuss any questions you have with your health care provider.  

## 2014-10-26 ENCOUNTER — Telehealth: Payer: Self-pay | Admitting: Family Medicine

## 2014-10-26 DIAGNOSIS — B373 Candidiasis of vulva and vagina: Secondary | ICD-10-CM | POA: Insufficient documentation

## 2014-10-26 DIAGNOSIS — B3731 Acute candidiasis of vulva and vagina: Secondary | ICD-10-CM | POA: Insufficient documentation

## 2014-10-26 MED ORDER — FLUCONAZOLE 150 MG PO TABS: 150.0000 mg | ORAL_TABLET | Freq: Once | ORAL | Status: DC

## 2014-10-26 NOTE — Telephone Encounter (Signed)
Called and informed patient she had a vaginal yeast infection and that I called in Diflucan.  She is not been scheduled for her follow-up obstetric ultrasound, advised her to call if she had not heard from them in the next 3-4 days.

## 2014-11-22 ENCOUNTER — Telehealth: Payer: Self-pay | Admitting: Family Medicine

## 2014-11-22 NOTE — Telephone Encounter (Signed)
Patient is concerned because she has sore throat since last Friday, and she doesn't know what she could take. Please, follow up with Patient (Spanish).

## 2014-11-24 NOTE — Telephone Encounter (Signed)
Will forward to MD to advise on what can be used during pregnancy. Jonny Dearden,CMA

## 2014-11-24 NOTE — Telephone Encounter (Signed)
Tylenol; Warm liquids; Honey; Cough drops

## 2014-11-26 NOTE — Telephone Encounter (Signed)
[place] interpreter used: Astrid [id number].  Message given to patient and she states that she has been using warm chamomille tea.  She has confirmed her appt on Monday with pcp. Jazmin Hartsell,CMA

## 2014-11-29 ENCOUNTER — Ambulatory Visit (INDEPENDENT_AMBULATORY_CARE_PROVIDER_SITE_OTHER): Payer: Self-pay | Admitting: Family Medicine

## 2014-11-29 VITALS — BP 110/65 | HR 92 | Temp 98.7°F | Wt 139.0 lb

## 2014-11-29 DIAGNOSIS — K219 Gastro-esophageal reflux disease without esophagitis: Secondary | ICD-10-CM | POA: Insufficient documentation

## 2014-11-29 DIAGNOSIS — Z3402 Encounter for supervision of normal first pregnancy, second trimester: Secondary | ICD-10-CM

## 2014-11-29 LAB — CBC
HCT: 35.1 % — ABNORMAL LOW (ref 36.0–46.0)
HEMOGLOBIN: 12.3 g/dL (ref 12.0–15.0)
MCH: 31.1 pg (ref 26.0–34.0)
MCHC: 35 g/dL (ref 30.0–36.0)
MCV: 88.6 fL (ref 78.0–100.0)
MPV: 9.9 fL (ref 8.6–12.4)
PLATELETS: 231 10*3/uL (ref 150–400)
RBC: 3.96 MIL/uL (ref 3.87–5.11)
RDW: 14 % (ref 11.5–15.5)
WBC: 8 10*3/uL (ref 4.0–10.5)

## 2014-11-29 LAB — GLUCOSE, CAPILLARY
Comment 1: 1
Glucose-Capillary: 167 mg/dL — ABNORMAL HIGH (ref 65–99)

## 2014-11-29 MED ORDER — RANITIDINE HCL 150 MG PO TABS: 150.0000 mg | ORAL_TABLET | Freq: Two times a day (BID) | ORAL | Status: DC

## 2014-11-29 NOTE — Patient Instructions (Addendum)
Fue genial ver que en la actualidad.  Tomar ranitidina, segn sea necesario para la Engineering geologist / reflujo [employer]. Si sus sntomas se producen ms de 1/2 das a la semana y [employer]s tomar diariamente por la noche. Pruebe con una banda de vientre para ayudar con el dolor de espalda. Proveedores almohadillas trmicas, Tylenol 650 mg hasta tres veces al da como necesidad y Theatre stage manager en la espalda. Elevar sus pies cuando pueda.  Por favor traiga todos sus medicamentos a cada visita de los doctores Inscrbete Mi carta para tener fcil acceso a los [place] de laboratorios, y la comunicacin con su mdico de atencin primaria.  Prxima cita Por favor, haga una cita con el Dr. Gayla Doss en 2 semanas  Miro adelante a hablar con usted otra vez en nuestra prxima visita. Si usted tiene [place] pregunta o duda antes de esa fecha, por favor llame a la clnica al [phone].  Cudate,  El Dr. Fernand Parkins trimestre de [place] (Second Trimester of Pregnancy) El segundo trimestre va desde la semana13 hasta la 28, desde el cuarto hasta el sexto mes, y suele ser el momento en el que mejor se siente. Su organismo se ha adaptado a Charity fundraiser y comienza a Diplomatic Services operational officer. En general, las nuseas matutinas han disminuido o han desaparecido completamente, p El segundo trimestre es tambin la poca en la que el feto se desarrolla rpidamente. Hacia el final del sexto mes, el feto mide aproximadamente 9pulgadas (23cm) y pesa alrededor de 1 libras (700g). Es probable que sienta que el beb se Teacher, English as a foreign language (da pataditas) entre las 18 y 20semanas del Psychiatrist. CAMBIOS EN EL ORGANISMO Su organismo atraviesa por muchos cambios durante el [place], y estos varan de Neomia Dear mujer a Educational psychologist.   Seguir [employer]. Notar que la parte baja del abdomen sobresale.  Podrn aparecer las primeras [employer]on's caderas, el abdomen y las [place].  Es posible que tenga dolores de cabeza que  pueden aliviarse con los medicamentos que su mdico autorice.  Tal vez tenga necesidad de orinar con ms frecuencia porque el feto est ejerciendo presin Ambulance person.  Debido al Vanetta Mulders podr sentir Anthoney Harada estomacal con frecuencia.  Puede estar estreida, ya que ciertas hormonas enlentecen los movimientos de los msculos que [employer] desechos a travs de los intestinos.  Pueden aparecer hemorroides o abultarse e hincharse las venas (venas varicosas).  Puede tener dolor de espalda que se debe al Citigroup de peso y a que las hormonas del Management consultant las articulaciones entre los huesos de la pelvis, y Public librarian consecuencia de la modificacin del peso y los msculos que mantienen el equilibrio.  Las [employer]ra Foods seguirn creciendo y Development worker, community.  Las Veterinary surgeon y estar sensibles al cepillado y al hilo dental.  Pueden aparecer zonas oscuras o manchas (cloasma, mscara del Psychiatrist) en el rostro que probablemente se atenuarn despus del nacimiento del beb.  Es posible que se forme una lnea oscura desde el ombligo hasta la zona del pubis (linea nigra) que probablemente se atenuarn despus del nacimiento del beb.  Tal vez haya cambios en el cabello que pueden incluir su engrosamiento, crecimiento rpido y cambios en la textura. Adems, a algunas mujeres se les cae el cabello durante o despus del embarazo, o tienen el cabello seco o fino. Lo ms probable es que el cabello se le normalice despus del nacimiento del beb. QU DEBE ESPERAR EN LAS CONSULTAS PRENATALES Durante una visita prenatal  de rutina:  La pesarn para asegurarse de que usted y el feto estn creciendo normalmente.  Le tomarn la presin arterial.  Le medirn el abdomen para controlar el desarrollo del beb.  Se escucharn los latidos cardacos fetales.  Se evaluarn los resultados de los estudios solicitados en visitas anteriores. El mdico puede preguntarle lo siguiente:  Cmo se siente.  Si siente los  movimientos del beb.  Si ha tenido sntomas anormales, como prdida de lquido, [place], dolores de cabeza intensos o clicos abdominales.  Si tiene [employer]. Otros estudios que podrn realizarse durante el segundo trimestre incluyen lo siguiente:  Anlisis de sangre para detectar:  Concentraciones de hierro bajas (anemia).  Diabetes gestacional (entre la semana 24 y la 28).  Anticuerpos Rh.  Anlisis de orina para detectar infecciones, diabetes o protenas en la orina.  Una ecografa para confirmar que el beb crece y se desarrolla correctamente.  Una amniocentesis para diagnosticar posibles problemas genticos.  Estudios del feto para descartar espina bfida y sndrome de [place]. INSTRUCCIONES PARA EL CUIDADO EN EL HOGAR   Evite fumar, consumir hierbas, beber alcohol y tomar frmacos que no le hayan recetado. Estas sustancias qumicas afectan la formacin y el desarrollo del beb.  Siga las indicaciones del mdico en relacin con el uso de medicamentos. Durante el embarazo, hay medicamentos que son seguros de tomar y otros que no.  Haga actividad fsica solo en la forma indicada por el mdico. Sentir clicos uterinos es un buen signo para Restaurant manager, fast food actividad fsica.  Contine comiendo alimentos que sanos con regularidad.  Use un sostn que le brinde buen soporte si le [employer].  No se d baos de inmersin en agua caliente, baos turcos ni saunas.  Colquese el cinturn de seguridad cuando conduzca.  No coma carne cruda ni queso sin cocinar; evite el contacto con las bandejas sanitarias de los gatos y la tierra que estos animales usan. Estos elementos contienen grmenes que pueden causar defectos congnitos en el beb.  Tome las vitaminas prenatales.  Si est estreida, pruebe un laxante suave (si el mdico lo autoriza). Consuma ms alimentos ricos en fibra, como vegetales y frutas frescos y Radiation protection practitioner. Beba gran cantidad de lquido para mantener  la orina de tono claro o color amarillo plido.  Dese baos de asiento con agua tibia para Engineer, materials o las molestias causadas por las hemorroides. Use una crema para las hemorroides si el mdico la autoriza.  Si tiene venas varicosas, use medias de descanso. Eleve los pies durante , 3 o 4veces por da. Limite la cantidad de sal en su dieta.  No levante objetos pesados, use zapatos de tacones bajos y [address].  Descanse con las piernas elevadas si tiene calambres o dolor de cintura.  Visite a su dentista si an no lo ha Occupational hygienist. Use un cepillo de dientes blando para higienizarse los dientes y psese el hilo dental con suavidad.  Puede seguir [employer], a menos que el mdico le indique lo contrario.  Concurra a todas las visitas prenatales segn las indicaciones de su mdico. SOLICITE ATENCIN MDICA SI:   [place].  Siente clicos leves, presin en la pelvis o dolor persistente en el abdomen.  Tiene nuseas, vmitos o diarrea persistentes.  Tiene secrecin vaginal con mal olor.  Siente dolor al [employer]oco[employer]. SOLICITE ATENCIN MDICA DE INMEDIATO SI:   Tiene fiebre.  Tiene una prdida de lquido por la vagina.  Tiene sangrado o  pequeas prdidas vaginales.  Siente dolor intenso o clicos en el abdomen.  Sube o baja de peso rpidamente.  Tiene dificultad para respirar y siente dolor de pecho.  Sbitamente se le hinchan mucho el rostro, las [place], los tobillos, los pies o las piernas.  No ha sentido los movimientos del beb durante Georgianne Fick.  Siente un dolor de cabeza intenso que no se alivia con medicamentos.  Hay cambios en la visin. Document Released: 12/06/2004 Document Revised: 03/03/2013 [facility] Patient Information 2015 [place], [place]. This information is not intended to replace advice given to you by your health care provider. Make sure you discuss any questions you have with your health  care provider.
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
## 2014-11-30 ENCOUNTER — Encounter: Payer: Self-pay | Admitting: Family Medicine

## 2014-12-03 ENCOUNTER — Other Ambulatory Visit: Payer: Self-pay

## 2014-12-03 DIAGNOSIS — Z3402 Encounter for supervision of normal first pregnancy, second trimester: Secondary | ICD-10-CM

## 2014-12-03 LAB — GLUCOSE, CAPILLARY: Glucose-Capillary: 101 mg/dL — ABNORMAL HIGH (ref 65–99)

## 2014-12-03 NOTE — Progress Notes (Signed)
3 hr gtt done today Jammy Plotkin 

## 2014-12-04 LAB — GLUCOSE TOLERANCE, 3 HOURS
GLUCOSE, FASTING-GESTATIONAL: 89 mg/dL (ref 65–99)
Glucose Tolerance, 1 hour: 198 mg/dL — ABNORMAL HIGH (ref 70–189)
Glucose Tolerance, 2 hour: 202 mg/dL — ABNORMAL HIGH (ref 70–164)
Glucose, GTT - 3 Hour: 118 mg/dL (ref 70–144)

## 2014-12-09 ENCOUNTER — Other Ambulatory Visit: Payer: Self-pay | Admitting: Family Medicine

## 2014-12-09 ENCOUNTER — Encounter: Payer: Self-pay | Admitting: Family Medicine

## 2014-12-09 DIAGNOSIS — O24419 Gestational diabetes mellitus in pregnancy, unspecified control: Secondary | ICD-10-CM

## 2014-12-09 DIAGNOSIS — R7309 Other abnormal glucose: Secondary | ICD-10-CM

## 2014-12-09 HISTORY — DX: Gestational diabetes mellitus in pregnancy, unspecified control: O24.419

## 2014-12-13 ENCOUNTER — Encounter: Payer: Self-pay | Admitting: Obstetrics & Gynecology

## 2014-12-15 ENCOUNTER — Ambulatory Visit (INDEPENDENT_AMBULATORY_CARE_PROVIDER_SITE_OTHER): Payer: Self-pay | Admitting: Family Medicine

## 2014-12-15 VITALS — BP 110/66 | HR 76 | Wt 143.0 lb

## 2014-12-15 DIAGNOSIS — O0993 Supervision of high risk pregnancy, unspecified, third trimester: Secondary | ICD-10-CM

## 2014-12-15 NOTE — Progress Notes (Signed)
Elizabeth [name] is a 29 y.o. G1P0 at [redacted]w[redacted]d for routine follow up.  She reports fatigue, difficulty sleeping; some pain in lower abdomen. Having some crampy pains, not sure if she is having contractions. No bleeding,  See flow sheet for details.  A/P: Pregnancy at [redacted]w[redacted]d.  Doing well.   Pregnancy issues include failed 3hr gtt, needs to go to [facility]  Infant feeding choice: breast Contraception choice: OCP Infant circumcision desired unsure  Tdapwas not given today. -- patient is self pay, planning on going to pharmacy or will ask [facility] if there are cheaper alternatives GBS/GC/CZ testing was not performed today. 2nd (now 3rd) trimester HIV/RPR today.  Preterm labor precautions reviewed. Safe sleep discussed. Kick counts reviewed. Follow up 2 weeks at [facility].

## 2014-12-16 LAB — HIV ANTIBODY (ROUTINE TESTING W REFLEX): HIV: NONREACTIVE

## 2014-12-16 LAB — RPR

## 2014-12-23 ENCOUNTER — Inpatient Hospital Stay (HOSPITAL_COMMUNITY)
Admission: AD | Admit: 2014-12-23 | Discharge: 2014-12-23 | Disposition: A | Discharge status: Home / Self Care | Payer: Self-pay | Source: Ambulatory Visit | Attending: Obstetrics & Gynecology | Admitting: Obstetrics & Gynecology

## 2014-12-23 ENCOUNTER — Encounter (HOSPITAL_COMMUNITY): Payer: Self-pay | Admitting: *Deleted

## 2014-12-23 DIAGNOSIS — B9789 Other viral agents as the cause of diseases classified elsewhere: Secondary | ICD-10-CM

## 2014-12-23 DIAGNOSIS — R05 Cough: Secondary | ICD-10-CM | POA: Insufficient documentation

## 2014-12-23 DIAGNOSIS — J069 Acute upper respiratory infection, unspecified: Secondary | ICD-10-CM | POA: Insufficient documentation

## 2014-12-23 DIAGNOSIS — Z3A31 31 weeks gestation of pregnancy: Secondary | ICD-10-CM | POA: Insufficient documentation

## 2014-12-23 DIAGNOSIS — O99513 Diseases of the respiratory system complicating pregnancy, third trimester: Secondary | ICD-10-CM | POA: Insufficient documentation

## 2014-12-23 LAB — INFLUENZA PANEL BY PCR (TYPE A & B)
H1N1 flu by pcr: NOT DETECTED
INFLBPCR: NEGATIVE
Influenza A By PCR: NEGATIVE

## 2014-12-23 MED ORDER — ACETAMINOPHEN 325 MG PO TABS
650.0000 mg | ORAL_TABLET | Freq: Once | ORAL | Status: AC
  Administered 2014-12-23: 650 mg via ORAL
  Filled 2014-12-23: qty 2

## 2014-12-23 NOTE — MAU Note (Signed)
Urine in lab 

## 2014-12-23 NOTE — MAU Note (Signed)
Started to feel symptoms since last Sun. Body aches, ? Fever in the morning- did not take temp, cough started yesterday, sore throat.

## 2014-12-23 NOTE — MAU Provider Note (Signed)
Chief Complaint:  No chief complaint on file.   First Provider Initiated Contact with Patient 12/23/14 1424      HPI: Elizabeth [name] is a 29 y.o. G1P0 at [redacted]w[redacted]d who presents to maternity admissions reporting sore throat, cough, chills x 5 days worsening today.  She is unsure of what to take since she is pregnant.  She reports her sister had similar symptoms 2 weeks ago.   She reports good fetal movement, denies regular contractions, LOF, vaginal bleeding, vaginal itching/burning, urinary symptoms, h/a, dizziness, or n/v.   Pt reports interest in waterbirth but does not have any information.     Sore Throat  This is a new problem. The current episode started in the past 7 days. The problem has been gradually worsening. Neither side of throat is experiencing more pain than the other. There has been no fever. The pain is moderate. Associated symptoms include congestion, coughing and headaches. Pertinent negatives include no abdominal pain, diarrhea, ear pain, neck pain, shortness of breath, swollen glands, trouble swallowing or vomiting. She has had no exposure to strep or mono. She has tried nothing for the symptoms.    Past Medical History: Past Medical History  Diagnosis Date  . Gestational diabetes 12/09/2014    Past obstetric history: OB History  Gravida Para Term Preterm AB SAB TAB Ectopic Multiple Living  1             # Outcome Date GA Lbr Len/2nd Weight Sex Delivery Anes PTL Lv  1 Current               Past Surgical History: Past Surgical History  Procedure Laterality Date  . Hand surgery      Family History: Family History  Problem Relation Age of Onset  . Hypertension Mother   . Hyperlipidemia Mother   . Diabetes Father     Social History: Social History  Substance Use Topics  . Smoking status: Never Smoker   . Smokeless tobacco: None  . Alcohol Use: Yes    Allergies: No Known Allergies  Meds:  No prescriptions prior to admission    ROS:   Review of Systems  Constitutional: Negative for fever, chills and fatigue.  HENT: Positive for congestion. Negative for ear pain, sinus pressure and trouble swallowing.   Eyes: Negative for photophobia.  Respiratory: Positive for cough. Negative for shortness of breath.   Cardiovascular: Negative for chest pain.  Gastrointestinal: Negative for nausea, vomiting, abdominal pain, diarrhea and constipation.  Genitourinary: Negative for dysuria, frequency, flank pain, vaginal bleeding, vaginal discharge, difficulty urinating, vaginal pain and pelvic pain.  Musculoskeletal: Negative for neck pain.  Neurological: Positive for headaches. Negative for dizziness and weakness.  Psychiatric/Behavioral: Negative.      I have reviewed patient's Past Medical Hx, Surgical Hx, Family Hx, Social Hx, medications and allergies.   Physical Exam   Patient Vitals for the past 24 hrs:  BP Temp Temp src Pulse Resp SpO2 Height Weight  12/23/14 1320 106/55 mmHg 98.3 F (36.8 C) Oral 87 18 100 % 4' 11.5" (1.511 m) 65.318 kg (144 lb)   Constitutional: Well-developed, well-nourished female in no acute distress.  Cardiovascular: normal rate Respiratory: normal effort GI: Abd soft, non-tender, gravid appropriate for gestational age.  MS: Extremities nontender, no edema, normal ROM Neurologic: Alert and oriented x 4.  GU: Neg CVAT.     FHT:  Baseline 145 , moderate variability, accelerations present, no decelerations Contractions: None on toco or to palpation  Labs: Results for orders placed or performed during the hospital encounter of 12/23/14 (from the past 24 hour(s))  Influenza panel by PCR (type A & B, H1N1)     Status: None   Collection Time: 12/23/14  2:20 PM  Result Value Ref Range   Influenza A By PCR NEGATIVE NEGATIVE   Influenza B By PCR NEGATIVE NEGATIVE   H1N1 flu by pcr NOT DETECTED NOT DETECTED   O/POS/-- (06/10 [id number])  Imaging:  No results found.  MAU Course/MDM: I have ordered  labs and reviewed results.  Treatments in MAU included Tylenol 650 mg PO.  Pt stable at time of discharge.  Assessment: 1. Viral URI with cough     Plan: Discharge home Increase PO fluids Pt given list of safe medications in pregnancy Verbal and written [facility] F[facility]U with [facility] as scheduled, pt transferred r/t GDM diagnosis       Follow-up Information    [facility]   Why:  As scheduled   Contact information:   [address] [place] [id number]       [facility].   Why:  As needed for emergencies   Contact information:   [address] [id number] mc [place] [id number] [phone]       Medication List    STOP taking these medications        fluconazole 150 MG tablet  Commonly known as:  DIFLUCAN     ranitidine 150 MG tablet  Commonly known as:  ZANTAC      TAKE these medications        GNP PRENATAL VITAMINS 28-0.8 MG Tabs  Take 1 capsule by mouth daily.        Sharen CounterLisa Leftwich-Kirby Certified Nurse-Midwife 12/23/2014 5:10 PM

## 2014-12-23 NOTE — Discharge Instructions (Signed)
Infeccin del tracto respiratorio superior, adultos (Upper Respiratory Infection, Adult) La mayora de las infecciones del tracto respiratorio superior son infecciones virales de las vas que llevan el aire a los pulmones. Un infeccin del tracto respiratorio superior afecta la nariz, la garganta y las vas respiratorias superiores. El tipo ms frecuente de infeccin del tracto respiratorio superior es la nasofaringitis, que habitualmente se conoce como "resfro comn". Las infecciones del tracto respiratorio superior siguen su curso y por lo general se curan solas. En la [employer], la infeccin del tracto respiratorio superior no requiere atencin [place], Armed forces training and education officer a veces, despus de una infeccin viral, puede surgir una infeccin bacteriana en las vas respiratorias superiores. Esto se conoce como infeccin secundaria. Las infecciones sinusales y en el odo medio son tipos frecuentes de infecciones secundarias en el tracto respiratorio superior. La neumona bacteriana tambin puede complicar un cuadro de infeccin del tracto respiratorio superior. Este tipo de infeccin puede empeorar el asma y la enfermedad pulmonar obstructiva crnica (EPOC). En algunos casos, estas complicaciones pueden requerir atencin mdica de emergencia y poner en peligro la vida.  CAUSAS Casi todas las infecciones del tracto respiratorio superior se deben a los virus. Un virus es un tipo de microbio que puede contagiarse de Ardelia Mems persona a Theatre manager.  FACTORES DE RIESGO Puede estar en riesgo de sufrir una infeccin del tracto respiratorio superior si:   Fuma.  Tiene una enfermedad pulmonar o cardaca crnica.  Tiene debilitado el sistema de defensa (inmunitario) del cuerpo.  Es muy joven o de edad muy [place].  Tiene asma o alergias nasales.  Trabaja en reas donde hay mucha gente o poca ventilacin.  Governor Rooks en una escuela o en un centro de atencin mdica. SIGNOS Y SNTOMAS  Habitualmente, los sntomas aparecen  de 2a 3das despus de entrar en contacto con el virus del resfro. La mayora de las infecciones virales en el tracto respiratorio superior duran de 7a 10das. Sin embargo, las infecciones virales en el tracto respiratorio superior a causa del virus de la gripe pueden durar de 14a 18das y, habitualmente, son ms graves. Entre los sntomas se pueden incluir los siguientes:   Secrecin o congestin nasal.  Estornudos.  Tos.  Dolor de Investment banker, operational.  Dolor de [place].  Fatiga.  Cristy Hilts.  Prdida del apetito.  Dolor en la frente, detrs de los ojos y por encima de los pmulos (dolor sinusal).  Dolores musculares. DIAGNSTICO  El mdico puede diagnosticar una infeccin del tracto respiratorio superior mediante los siguientes estudios:  Examen fsico.  Pruebas para verificar si los sntomas no se deben a otra afeccin, por ejemplo:  Faringitis estreptoccica.  Sinusitis.  Neumona.  Asma. TRATAMIENTO  Esta infeccin desaparece sola, con el tiempo. No puede curarse con medicamentos, pero a menudo se prescriben para aliviar los sntomas. Los medicamentos pueden ser tiles para lo siguiente:  Engineer, materials fiebre.  Reducir la tos.  Aliviar la congestin nasal. INSTRUCCIONES PARA EL CUIDADO EN EL HOGAR   Tome los medicamentos solamente como se lo haya indicado el mdico.  A fin de Best boy de garganta, haga grgaras con solucin salina templada o consuma caramelos para la tos, como se lo haya indicado el mdico.  Use un humidificador de vapor clido o inhale el vapor de la ducha para aumentar la humedad del aire. Esto facilitar la respiracin.  Beba suficiente lquido para Consulting civil engineer orina clara o de color amarillo plido.  Consuma sopas y otros caldos transparentes, y [employer].  Descanse todo lo  que sea necesario.  Regrese al Mat Carne cuando la temperatura se le haya normalizado o cuando el mdico lo autorice. Es posible que deba quedarse en su casa durante  un tiempo prolongado, para no infectar a los dems. [place] usar un barbijo y lavarse las manos con cuidado para Mining engineer propagacin del virus.  Aumente el uso del inhalador si tiene asma.  No consuma ningn producto que contenga tabaco, lo que incluye cigarrillos, tabaco de Higher education careers adviser o Psychologist, sport and exercise. Si necesita ayuda para dejar de fumar, consulte al [employer]. PREVENCIN  La mejor manera de protegerse de un resfro es mantener una higiene [place].   Evite el contacto oral o fsico con personas que tengan sntomas de resfro.  En caso de contacto, lvese las manos con frecuencia. No hay pruebas claras de que la vitaminaC, la vitaminaE, la equincea o el ejercicio reduzcan la probabilidad de Museum/gallery curator un resfro. Sin embargo, siempre se recomienda Scientific laboratory technician, hacer ejercicio y Ecologist.  SOLICITE ATENCIN MDICA SI:   Su estado empeora en lugar de mejorar.  Los medicamentos no Animator.  Tiene escalofros.  La sensacin de falta de aire empeora.  Tiene mucosidad marrn o roja.  Tiene secrecin nasal amarilla o marrn.  Le duele la cara, especialmente al inclinarse hacia adelante.  Tiene fiebre.  Tiene los ganglios del cuello hinchados.  Siente dolor al tragar.  Tiene zonas blancas en la parte de atrs de la garganta. SOLICITE ATENCIN MDICA DE INMEDIATO SI:   Tiene sntomas intensos o persistentes de:  Dolor de [place].  Dolor de odos.  Dolor sinusal.  Dolor en el pecho.  Tiene enfermedad pulmonar crnica y cualquiera de estos sntomas:  Sibilancias.  Tos prolongada.  Tos con sangre.  Cambio en la mucosidad habitual.  Presenta rigidez en el cuello.  Tiene cambios en:  La visin.  La audicin.  El pensamiento.  El Quail de nimo. ASEGRESE DE QUE:   Comprende estas instrucciones.  Controlar su afeccin.  Recibir ayuda de inmediato si no mejora o si empeora.   Esta informacin no tiene Buyer, retail el consejo del mdico. Asegrese de hacerle al mdico cualquier pregunta que tenga.   Document Released: 12/06/2004 Document Revised: 07/13/2014 Elsevier Interactive Patient Education 2016 [place]???  You must attend a Doren Custard class at [facility]  3rd Wednesday of every month from [employer] by calling [phone] or online at [email or web address]  Bring [place] the certificate from the class   Waterbirth supplies needed for [facility]/Health Department patients:  Our practice has a Heritage manager in a Box tub at the hospital that you can borrow  You will need to purchase an accessory kit that has all needed supplies through [facility] [phone]) or online $175.00  Or you can purchase the supplies separately: o Single-use disposable tub liner for [employer] in a Box (REGULAR size) o New garden hose labeled "lead-free", suitable for drinking water", o Electric drain pump to remove water (We recommend 792 gallon per hour or greater pump.)  o  "non-toxic" OR "water potable o Garden hose to remove the dirty water o Fish net o Bathing suit top (optional) o Long-handled mirror (optional)  GotWeb[employer].is sells tubs for ~ $120 if you would rather purchase your own tub  [facility] (www.[email or web address]) $275 for tub rental/set-up & take down/kit    Things that would prevent you from having a waterbirth:  Premature, <37wks  Previous cesarean birth  Presence of thick meconium-stained fluid  Multiple gestation (Twins, triplets, etc.)  Uncontrolled diabetes  Hypertension  Heavy vaginal bleeding  Non-reassuring fetal heart rate  Active infection (MRSA, etc.)  If your labor has to be induced and induction method requires continuous monitoring of the baby's heart rate  Other risks/issues identified by your obstetrical provider Pensando en parto en  el agua ???  Usted debe asistir a una clase de parto en el agua en [facility] de la Mujer 3er mircoles de cada mes de 7-9 pm Gratis Inscribirse llamando al [phone] o en lnea en [email or web address] Nos traen el certificado de la clase   suministros necesarios para el parto en agua pacientes / / Dion Body / Departamento de Salud Theodore Clnica de la Mujer: Cleotis Nipper prctica tiene una piscina nacimiento en una tina de caja en el hospital que usted puede pedir prestado Usted tendr que comprar un kit de accesorios que tiene todos los materiales necesarios a [employer] de la [place] (Boutique) [phone] o en lnea $ 175.00 O puede comprar los suministros por separado: Tennis Must un solo uso desechable para revestimiento de baera de nacimiento piscina en una caja (tamao normal) manguera de jardn nuevo con la etiqueta "libre de plomo", "adecuado para el agua potable", bomba de drenaje para Geographical information systems officer (Recomendamos 792 galones por hora o mayor bomba.) "No txicos" o "agua potable" manguera de jardn para eliminar el agua sucia red de pesca Traje de bao superior (opcional) espejo de mango largo (opcional) [email or web address].is vende baeras para ~ $ 120 si usted prefiere comprar su propia baera Las seoras del [place] ([email or web address]) $ 275 para alquiler de baera / set-up y tomar abajo / kit   Las cosas que le impiden tener un parto en el agua: TEFL teacher, <37wks parto por cesrea anterior La presencia de lquido espeso teido de meconio La gestacin mltiple (gemelos, trillizos, Social research officer, government.) La diabetes no controlada Hipertensin sangrado vaginal abundante la frecuencia cardaca fetal no tranquilizador La infeccin activa (MRSA, etc.) Si su trabajo tiene que ser inducida y el mtodo de induccin requiere una vigilancia continua de la frecuencia cardaca del beb Otros riesgos / problemas identificados por su proveedor obsttrico

## 2014-12-27 ENCOUNTER — Ambulatory Visit (INDEPENDENT_AMBULATORY_CARE_PROVIDER_SITE_OTHER): Payer: Medicaid Other | Admitting: Obstetrics and Gynecology

## 2014-12-27 ENCOUNTER — Encounter: Payer: Self-pay | Attending: Obstetrics & Gynecology | Admitting: *Deleted

## 2014-12-27 ENCOUNTER — Encounter: Payer: Self-pay | Admitting: Obstetrics and Gynecology

## 2014-12-27 VITALS — BP 112/71 | HR 95 | Temp 98.3°F | Ht 60.0 in | Wt 142.7 lb

## 2014-12-27 DIAGNOSIS — O0990 Supervision of high risk pregnancy, unspecified, unspecified trimester: Secondary | ICD-10-CM

## 2014-12-27 DIAGNOSIS — O24419 Gestational diabetes mellitus in pregnancy, unspecified control: Secondary | ICD-10-CM

## 2014-12-27 DIAGNOSIS — O4440 Low lying placenta NOS or without hemorrhage, unspecified trimester: Secondary | ICD-10-CM

## 2014-12-27 DIAGNOSIS — Z713 Dietary counseling and surveillance: Secondary | ICD-10-CM | POA: Insufficient documentation

## 2014-12-27 DIAGNOSIS — O4443 Low lying placenta NOS or without hemorrhage, third trimester: Secondary | ICD-10-CM

## 2014-12-27 DIAGNOSIS — O0993 Supervision of high risk pregnancy, unspecified, third trimester: Secondary | ICD-10-CM

## 2014-12-27 DIAGNOSIS — Z23 Encounter for immunization: Secondary | ICD-10-CM

## 2014-12-27 LAB — POCT URINALYSIS DIP (DEVICE)
BILIRUBIN URINE: NEGATIVE
Glucose, UA: NEGATIVE mg/dL
Ketones, ur: NEGATIVE mg/dL
NITRITE: NEGATIVE
PH: 7 (ref 5.0–8.0)
Protein, ur: NEGATIVE mg/dL
Specific Gravity, Urine: 1.025 (ref 1.005–1.030)
Urobilinogen, UA: 0.2 mg/dL (ref 0.0–1.0)

## 2014-12-27 MED ORDER — TETANUS-DIPHTH-ACELL PERTUSSIS 5-2.5-18.5 LF-MCG/0.5 IM SUSP
0.5000 mL | Freq: Once | INTRAMUSCULAR | Status: AC
  Administered 2014-12-27: 0.5 mL via INTRAMUSCULAR

## 2014-12-27 NOTE — Progress Notes (Signed)
Here for first visit. [facility] Used interpreter Elizabeth [name] [name]. C/o difficult to breathe when lying down sometimes.Oxygen saturation 100%. Given prenatal education booklets. C/o vaginal discharge white to yellow .States was sick recently on 12/23/14 and came to [facility], but states feels a lot better today. Urinalysis shows large leukocytes and trace hemoglobin.

## 2014-12-27 NOTE — Progress Notes (Signed)
  Patient was seen on 12/27/14 for Gestational Diabetes self-management . The following learning objectives were met by the patient :   States the definition of Gestational Diabetes  States when to check blood glucose levels  Demonstrates proper blood glucose monitoring techniques  States the effect of stress and exercise on blood glucose levels  States the importance of limiting caffeine and abstaining from alcohol and smoking  Plan:  Consider  increasing your activity level by walking daily as tolerated Begin checking BG before breakfast and 2 hours after first bit of breakfast, lunch and dinner after  as directed by MD  Take medication  as directed by MD  Blood glucose monitor given: True Track Lot # [id number] Exp: 2016/11/09 Blood glucose reading: FBS 78m/dl  Patient instructed to monitor glucose levels: FBS: 60 - <90 1 hour: <140 2 hour: <120  Patient received the following handouts:  Nutrition Diabetes and Pregnancy  Carbohydrate Counting List  Meal Planning worksheet  Patient will be seen for follow-up as needed.

## 2014-12-27 NOTE — Progress Notes (Signed)
Subjective:  Elizabeth [name] is a 29 y.o. G1P0 at 5063w6d being seen today for initial prenatal care.  Patient reports recent URI, feeling better.  Contractions: Not present.  Vag. Bleeding: None. Movement: Present. Denies leaking of fluid.   The following portions of the patient's history were reviewed and updated as appropriate: allergies, current medications, past family history, past medical history, past social history, past surgical history and problem list. Problem list updated.  Objective:   Filed Vitals:   12/27/14 0806  BP: 112/71  Pulse: 95  Temp: 98.3 F (36.8 C)  Weight: 142 lb 11.2 oz (64.728 kg)    Fetal Status: Fetal Heart Rate (bpm): 152 Fundal Height: 32 cm Movement: Present     General:  Alert, oriented and cooperative. Patient is in no acute distress.  Skin: Skin is warm and dry. No rash noted.   Cardiovascular: Normal heart rate noted  Respiratory: Normal respiratory effort, no problems with respiration noted  Abdomen: Soft, gravid, appropriate for gestational age. Pain/Pressure: Present     Pelvic: Vag. Bleeding: None Vag D/C Character: Yellow   Cervical exam deferred        Extremities: Normal range of motion.  Edema: Trace  Mental Status: Normal mood and affect. Normal behavior. Normal judgment and thought content.   Urinalysis: Urine Protein: Negative Urine Glucose: Negative  Assessment and Plan:  Pregnancy: G1P0 at 4663w6d  1. Gestational diabetes mellitus, unspecified diabetic control, unspecified trimester - Diabetes education, nutrition - f/u nurse educator one week, if needs meds will need to schedule antenatal testing, that nurse is aware  2. Supervision of high-risk pregnancy, unspecified trimester - ultrasound request from Dr. Gaynell FaceMarshall, will need to review to see if dating needs to be changed because patient's pregnancy overview in the 'snapshot' function says that her 17 wk u/s differed from sure lmp by over 3 weeks, which would cause us  to change the patient's edc - tdap, flu today - says had low-lying placenta on initial u/s that resolved with second, will need to confirm that  Preterm labor symptoms and general obstetric precautions including but not limited to vaginal bleeding, contractions, leaking of fluid and fetal movement were reviewed in detail with the patient. Please refer to After Visit Summary for other counseling recommendations.  Return in about 2 weeks (around 01/10/2015).   Kathrynn RunningNoah Bedford Cheryl Chay, MD

## 2014-12-27 NOTE — Progress Notes (Signed)
Nutrition note: 1st visit consult & GDM diet education Pt was recently diagnosed with GDM. Pt has gained 22.7# @ 6843w6d, which is wnl. Pt reports eating 3 meals & 1-2 snacks/d. Pt is taking a PNV. Pt reports no N&V but has heartburn occ. NKFA. Pt reports no physical activity or walking. Pt received verbal & written education in Spanish via an interpreter on GDM diet. Encouraged ~30 mins of walking/d. Discussed wt gain goals of 25-35# or 1#/wk. Pt agrees to follow GDM diet with 3 meals & 1-3 snacks/d with proper CHO/ protein combination. Pt has WIC & plans to BF. F/u in 2-4 wks Blondell RevealLaura Linnaea Ahn, MS, RD, LDN, [facility]

## 2015-01-01 ENCOUNTER — Encounter: Payer: Self-pay | Admitting: *Deleted

## 2015-01-03 ENCOUNTER — Encounter: Payer: Self-pay | Admitting: *Deleted

## 2015-01-03 ENCOUNTER — Ambulatory Visit: Payer: Self-pay | Admitting: *Deleted

## 2015-01-03 DIAGNOSIS — O24419 Gestational diabetes mellitus in pregnancy, unspecified control: Secondary | ICD-10-CM

## 2015-01-03 NOTE — Progress Notes (Signed)
Patient present for review of glucose readings. FBS 5:7 WNL (60-95) and  whpp 7:19 WNL. Will encourage tigher nutrition compliance and reevaluate next week with MD appointment.

## 2015-01-10 ENCOUNTER — Encounter: Payer: Self-pay | Admitting: Family Medicine

## 2015-01-10 ENCOUNTER — Ambulatory Visit (INDEPENDENT_AMBULATORY_CARE_PROVIDER_SITE_OTHER): Payer: Self-pay | Admitting: Family Medicine

## 2015-01-10 VITALS — BP 108/58 | HR 78 | Wt 143.1 lb

## 2015-01-10 DIAGNOSIS — O4443 Low lying placenta NOS or without hemorrhage, third trimester: Secondary | ICD-10-CM

## 2015-01-10 DIAGNOSIS — O4440 Low lying placenta NOS or without hemorrhage, unspecified trimester: Secondary | ICD-10-CM

## 2015-01-10 DIAGNOSIS — O24419 Gestational diabetes mellitus in pregnancy, unspecified control: Secondary | ICD-10-CM

## 2015-01-10 DIAGNOSIS — O0993 Supervision of high risk pregnancy, unspecified, third trimester: Secondary | ICD-10-CM

## 2015-01-10 LAB — POCT URINALYSIS DIP (DEVICE)
BILIRUBIN URINE: NEGATIVE
Glucose, UA: NEGATIVE mg/dL
KETONES UR: NEGATIVE mg/dL
NITRITE: NEGATIVE
Protein, ur: NEGATIVE mg/dL
Specific Gravity, Urine: 1.02 (ref 1.005–1.030)
Urobilinogen, UA: 0.2 mg/dL (ref 0.0–1.0)
pH: 7 (ref 5.0–8.0)

## 2015-01-10 NOTE — Progress Notes (Signed)
Subjective:  Elizabeth [name] is a 29 y.o. G1P0 at 6069w6d being seen today for ongoing prenatal care.  Patient reports no complaints.  Contractions: Not present.  Vag. Bleeding: None. Movement: Present. Denies leaking of fluid.   Fasting: 93-109, only 2 over 95 2hr PP 86-122 AM Lunch 98-128 Dinner 93-150, only two over 140   Reports she is exercising 2 times per week.   Desires water birth, discussed in detail.   The following portions of the patient's history were reviewed and updated as appropriate: allergies, current medications, past family history, past medical history, past social history, past surgical history and problem list. Problem list updated.  Objective:   Filed Vitals:   01/10/15 1036  BP: 108/58  Pulse: 78  Weight: 143 lb 1.6 oz (64.91 kg)    Fetal Status: Fetal Heart Rate (bpm): 148 Fundal Height: 34 cm Movement: Present  Presentation: Vertex  General:  Alert, oriented and cooperative. Patient is in no acute distress.  Skin: Skin is warm and dry. No rash noted.   Cardiovascular: Normal heart rate noted  Respiratory: Normal respiratory effort, no problems with respiration noted  Abdomen: Soft, gravid, appropriate for gestational age. Pain/Pressure: Present     Pelvic: Vag. Bleeding: None Vag D/C Character: White   Cervical exam deferred        Extremities: Normal range of motion.  Edema: Trace  Mental Status: Normal mood and affect. Normal behavior. Normal judgment and thought content.   Urinalysis: Urine Protein: Negative Urine Glucose: Negative  Assessment and Plan:  Pregnancy: G1P0 at 3569w6d  1. Supervision of high-risk pregnancy, third trimester -Signed waterbirth consent today and reviewed contraindications if developed later in pregnancy including the following:  Preterm birth less than 37 weeks, thick, particulate meconium stained fluid, Maternal fever over 101, heavy bleeding or signs of placental abruption, pre-eclampsia, abnormal fetal heart  rate pattern, breech presentation, active communicable infection (this does NOT include group B strep), significant limitation to mobility, or any other condition per provider discretion. - Updated pregnancy box  2. Low-lying placenta Resolved per [place] (in media)  3. Gestational diabetes mellitus, unspecified diabetic control, unspecified trimester Diet controlled, no need to start medication IOL at 40 weeks given well controlled DM, discussed that IOL would mean water birth is not an option. Voiced understanding.  Consider starting evening primrose oil at 38 weeks and performing membrane sweeping at that time as well.   Preterm labor symptoms and general obstetric precautions including but not limited to vaginal bleeding, contractions, leaking of fluid and fetal movement were reviewed in detail with the patient. Please refer to After Visit Summary for other counseling recommendations.   Return in about 2 weeks (around 01/24/2015) for Routine prenatal care.  Federico FlakeKimberly Niles Newton, MD

## 2015-01-10 NOTE — Progress Notes (Signed)
Reviewed breast feeding tip of the week. Used spanish interpreter Elna Breslowarol Hernandez.

## 2015-01-10 NOTE — Patient Instructions (Addendum)
Safe Medications in Pregnancy   Acne: Benzoyl Peroxide Salicylic Acid  Backache/Headache: Tylenol: 2 regular strength every 4 hours OR              2 Extra strength every 6 hours  Colds/Coughs/Allergies: Benadryl (alcohol free) 25 mg every 6 hours as needed Breath right strips Claritin Cepacol throat lozenges Chloraseptic throat spray Cold-Eeze- up to three times per day Cough drops, alcohol free Flonase (by prescription only) Guaifenesin Mucinex Robitussin DM (plain only, alcohol free) Saline nasal spray/drops Sudafed (pseudoephedrine) & Actifed ** use only after [redacted] weeks gestation and if you do not have high blood pressure Tylenol Vicks Vaporub Zinc lozenges Zyrtec   Constipation: Colace Ducolax suppositories Fleet enema Glycerin suppositories Metamucil Milk of magnesia Miralax Senokot Smooth move tea  Diarrhea: Kaopectate Imodium A-D  *NO pepto Bismol  Hemorrhoids: Anusol Anusol HC Preparation H Tucks  Indigestion: Tums Maalox Mylanta Zantac  Pepcid  Insomnia: Benadryl (alcohol free) 25m every 6 hours as needed Tylenol PM Unisom, no Gelcaps  Leg Cramps: Tums MagGel  Nausea/Vomiting:  Bonine Dramamine Emetrol Ginger extract Sea bands Meclizine  Nausea medication to take during pregnancy:  Unisom (doxylamine succinate 25 mg tablets) Take one tablet daily at bedtime. If symptoms are not adequately controlled, the dose can be increased to a maximum recommended dose of two tablets daily (1/2 tablet in the morning, 1/2 tablet mid-afternoon and one at bedtime). Vitamin B6 1053mtablets. Take one tablet twice a day (up to 200 mg per day).  Skin Rashes: Aveeno products Benadryl cream or 2547mvery 6 hours as needed Calamine Lotion 1% cortisone cream  Yeast infection: Gyne-lotrimin 7 Monistat 7   **If taking multiple medications, please check labels to avoid duplicating the same active ingredients **take medication as directed on  the label ** Do not exceed 4000 mg of tylenol in 24 hours **Do not take medications that contain aspirin or ibuprofen  Thinking About Waterbirth???  You must attend a [facility] from 7-9pm  [employer] calling [phone] online at [email or web address] us [place] certificate from the class  Waterbirth supplies needed for [facility] Holdingstients:  Our practice has a [facility] manager a Box tub at the hospital that you can borrow  You will need to purchase an accessory kit that has all needed supplies through [facility] ) or online  Or you can purchase the supplies separately: o Single-use disposable tub liner for BirMorgan Stanley a Box (REGULAR size) o New garden hose labeled "lead-free", "suitable for drinking water", "non-toxic" OR "water potable" o Garden hose to remove the dirty water o Electric drain pump to remove water (We recommend 792 gallon per hour or greater pump.)  o Fish net o Bathing suit top (optional) o Long-handled mirror (optional)  [email or web address] tubs for ~ $120 if you would rather purchase your own tub  [employer] ([email or web address]) $275 for tub rental/set-up & take down/kit   Things that would prevent you from having a waterbirth:  Premature, <37wks  Previous cesarean birth  Presence of thick meconium-stained fluid  Multiple gestation (Twins, triplets, etc.)  Uncontrolled diabetes  Hypertension  Heavy vaginal bleeding  Non-reassuring fetal heart rate  Active infection (MRSA, etc.)  If your labor has to be induced  Other risk issues identified by your obstetrical provider  Considering WatDoren Custarduide for patients at [facility] consider [facility]birth?  .Marland Kitchen  Gentle birth for babies . Less pain medicine used in labor . May allow for passive descent/less pushing . May reduce perineal  tears  . More mobility and instinctive maternal position changes . Increased maternal relaxation . Reduced blood pressure in labor  Is waterbirth safe? What are the risks of infection, drowning or other complications?  . Infection: o Very low risk (3.7 % for tub vs 4.8% for bed) o 7 in 8000 waterbirths with documented infection o Poorly cleaned equipment most common cause o Slightly lower group B strep transmission rate  . Drowning o Maternal:  - Very low risk   - Related to seizures or fainting o Newborn:  - Very low risk. No evidence of increased risk of respiratory problems in multiple large studies - Physiological protection from breathing under water - Avoid underwater birth if there are any fetal complications - Once baby's head is out of the water, keep it out.  . Birth complication o Some reports of cord trauma, but risk decreased by bringing baby to surface gradually o No evidence of increased risk of shoulder dystocia. Mothers can usually change positions faster in water than in a bed, possibly aiding the maneuvers to free the shoulder.   Am I a candidate for waterbirth?  Yes, if you are: . Full-term (37 weeks or greater)  . Have had an uncomplicated pregnancy and labor  No, if you have: Marland Kitchen Preterm birth less than 37 weeks . Thick, particulate meconium stained fluid . Maternal fever over 101 . Heavy bleeding or signs of placental abruption . Pre-eclampsia  . Any abnormal fetal heart rate pattern . Breech presentation . Twins  . Very large baby . Active communicable infection (this does NOT include group B strep) . Significant limitation to mobility  Please remember that birth is unpredictable. Under certain unforeseeable circumstances your provider may advise against giving birth in the tub. These decisions will be made on a case-by-case basis and with the safety of you and your baby as our highest priority.  Requirements for patients planning  waterbirth  . Ask your midwife if you will be a candidate for waterbirth. . Attend the [facility] at [employer] at [phone] or [phone] for dates and times. The class is free and we strongly encourage you to bring your support person. You will receive a certificate of participation to show to your midwife or doctor. . Supplies needed for [facility] and [facility] [employer] patients: o Single-use disposable tub liner ([email or web address]  REGULAR size) o New garden hose labeled "lead-free", "suitable for drinking water", "non-toxic" OR "water potable" o Garden hose to remove the dirty water o Faucet adaptor to attach hose to faucet         o Electric drain pump to remove water (We recommend 792 gallon per hour or greater pump.)  o Fish net o Bathing suit top (optional) o Long-handled mirror (optional)  [email or web address] sells tubs for $120 if you would rather purchase your own tub

## 2015-01-24 ENCOUNTER — Ambulatory Visit (INDEPENDENT_AMBULATORY_CARE_PROVIDER_SITE_OTHER): Payer: Medicaid Other | Admitting: Family Medicine

## 2015-01-24 VITALS — BP 115/68 | HR 87 | Temp 98.9°F | Wt 146.4 lb

## 2015-01-24 DIAGNOSIS — O24419 Gestational diabetes mellitus in pregnancy, unspecified control: Secondary | ICD-10-CM

## 2015-01-24 DIAGNOSIS — Z113 Encounter for screening for infections with a predominantly sexual mode of transmission: Secondary | ICD-10-CM

## 2015-01-24 DIAGNOSIS — O0993 Supervision of high risk pregnancy, unspecified, third trimester: Secondary | ICD-10-CM

## 2015-01-24 LAB — POCT URINALYSIS DIP (DEVICE)
BILIRUBIN URINE: NEGATIVE
Glucose, UA: 250 mg/dL — AB
HGB URINE DIPSTICK: NEGATIVE
KETONES UR: NEGATIVE mg/dL
Nitrite: NEGATIVE
PH: 6 (ref 5.0–8.0)
Protein, ur: NEGATIVE mg/dL
Specific Gravity, Urine: <=1.005 (ref 1.005–1.030)
Urobilinogen, UA: 0.2 mg/dL (ref 0.0–1.0)

## 2015-01-24 NOTE — Patient Instructions (Signed)
Diabetes mellitus gestacional (Gestational Diabetes Mellitus) La diabetes mellitus gestacional, ms comnmente conocida como diabetes gestacional es un tipo de diabetes que desarrollan algunas mujeres durante el embarazo. En la diabetes gestacional, el pncreas no produce suficiente insulina (una hormona) o las clulas son menos sensibles a la insulina producida (resistencia a la insulina), o ambas cosas. Normalmente, la insulina mueve los azcares de los alimentos a las clulas de los tejidos. Las clulas de los tejidos utilizan los azcares para obtener energa. La falta de insulina o la falta de una respuesta normal a la insulina hace que el exceso de azcar se acumule en la sangre en lugar de penetrar en las clulas de los tejidos. Como resultado, se producen niveles altos de azcar en la sangre (hiperglucemia). El efecto de los niveles altos de azcar (glucosa) puede causar muchos problemas.  FACTORES DE RIESGO Usted tiene mayor probabilidad de desarrollar diabetes gestacional si tiene antecedentes familiares de diabetes y tambin si tiene uno o ms de los siguientes factores de riesgo:  ndice de masa corporal superior a 30 (obesidad).  Embarazo previo con diabetes gestacional.  La edad avanzada en el momento del embarazo. Si se mantienen los niveles de glucosa en la sangre en un rango normal durante el embarazo, las mujeres pueden tener un embarazo saludable. Si los niveles de glucosa en la sangre no estn bien controlados, puede haber riesgos para usted, el feto o el recin nacido, o durante el trabajo de parto y el parto.  SNTOMAS  Si se presentan sntomas, stos son similares a los sntomas que normalmente experimentar durante el embarazo. Los sntomas de la diabetes gestacional son:   Aumento de la sed (polidipsia).  Aumento de la miccin (poliuria).  Orina con ms frecuencia durante la noche (nocturia).  Prdida de peso. La prdida de peso puede ser muy rpida.  Infecciones  frecuentes y recurrentes.  Cansancio (fatiga).  Debilidad.  Cambios en la visin, como visin borrosa.  Olor a fruta en el aliento.  Dolor abdominal. DIAGNSTICO La diabetes se diagnostica cuando hay aumento de los niveles de glucosa en la sangre. El nivel de glucosa en la sangre puede controlarse en uno o ms de los siguientes anlisis de sangre:  Medicin de glucosa en la sangre en ayunas. No se le permitir comer durante al menos 8 horas antes de que se tome una muestra de sangre.  Pruebas al azar de glucosa en la sangre. El nivel de glucosa en la sangre se controla en cualquier momento del da sin importar el momento en que haya comido.  Prueba de tolerancia a la glucosa oral (PTGO). La glucosa en la sangre se mide despus de no haber comido (ayunas) durante una a tres horas y despus de beber una bebida que contenga glucosa. Dado que las hormonas que causan la resistencia a la insulina son ms altas alrededor de las semanas 24 a 28 de embarazo, generalmente se realiza una PTGO durante ese tiempo. Si tiene factores de riesgo, en la primera visita prenatal pueden hacerle pruebas de deteccin de diabetes tipo 2 no diagnosticada. TRATAMIENTO  La diabetes gestacional debe controlarse en primer lugar con dieta y ejercicios. Pueden agregarse medicamentos, pero solo si son necesarios.  Usted tendr que tomar medicamentos para la diabetes o insulina diariamente para mantener los niveles de glucosa en la sangre en el rango deseado.  Usted tendr que combinar la dosis de insulina con la actividad fsica y la eleccin de alimentos saludables. Si tiene diabetes gestacional, el objetivo del tratamiento ser   mantener los siguientes niveles sanguneos de glucosa:  Antes de las comidas (preprandial): valor de 95 mg/dl o inferior.  Despus de las comidas (posprandial):  Una hora despus de la comida: valor de 140 mg/dl o inferior.  Dos horas despus de la comida: valor de 120 mg/dl o  inferior. Si tiene diabetes tipo 1 o tipo 2 preexistente, el objetivo del tratamiento ser mantener los siguientes niveles sanguneos de glucosa:  Antes de las comidas, a la hora de acostarse y durante la noche: de 60 a 99 mg/dl.  Despus de las comidas: valor mximo de 100 a 129 mg/dl. INSTRUCCIONES PARA EL CUIDADO EN EL HOGAR   Controle su nivel de hemoglobina A1c dos veces al ao.  Contrlese a diario el nivel de glucosa en la sangre segn las indicaciones de su mdico. Es comn realizar controles frecuentes de la glucosa en la sangre.  Supervise las cetonas en la orina cuando est enferma y segn las indicaciones de su mdico.  Tome el medicamento para la diabetes y adminstrese insulina segn las indicaciones de su mdico para mantener el nivel de glucosa en la sangre en el rango deseado.  Nunca se quede sin medicamento para la diabetes o sin insulina. Es necesario que la reciba todos los das.  Ajuste la insulina segn la ingesta de hidratos de carbono. Los hidratos de carbono pueden aumentar los niveles de glucosa en la sangre, pero deben incluirse en su dieta. Los hidratos de carbono aportan vitaminas, minerales y fibra que son una parte esencial de una dieta saludable. Los hidratos de carbono se encuentran en frutas, verduras, cereales integrales, productos lcteos, legumbres y alimentos que contienen azcares aadidos.  Consuma alimentos saludables. Alterne 3 comidas con 3 colaciones.  Aumente de peso saludablemente. El aumento del peso total vara de acuerdo con el ndice de masa corporal que tena antes del embarazo ([facility]).  Lleve una tarjeta de alerta mdica o use una pulsera o medalla de alerta mdica.  Lleve con usted una colacin de 15gramos de hidratos de carbono en todo momento para controlar los niveles bajos de glucosa en la sangre (hipoglucemia). Algunos ejemplos de colaciones de 15gramos de hidratos de carbono son los siguientes:  Tabletas de glucosa, 3 o 4.  Gel  de glucosa, tubo de 15 gramos.  Pasas de uva, 2 cucharadas (24 g).  Caramelos de goma, 6.  Galletas de animales, 8.  Jugo de fruta, gaseosa comn, o leche descremada, 4 onzas (120 ml).  Pastillas de goma, 9.  Reconocer la hipoglucemia. Durante el embarazo la hipoglucemia se produce cuando hay niveles de glucosa en la sangre de 60 mg/dl o menos. El riesgo de hipoglucemia aumenta durante el ayuno o cuando se saltea las comidas, durante o despus de realizar ejercicio intenso y mientras duerme. Los sntomas de hipoglucemia son:  Temblores o sacudidas.  Disminucin de la capacidad de concentracin.  Sudoracin.  Aumento de la frecuencia cardaca.  Dolor de cabeza.  Sequedad en la boca.  Hambre.  Irritabilidad.  Ansiedad.  Sueo agitado.  Alteracin del habla o de la coordinacin.  Confusin.  Tratar la hipoglucemia rpidamente. Si usted est alerta y puede tragar con seguridad, siga la regla de 15/15 que consiste en:  Tome entre 15 y 20gramos de glucosa de accin rpida o carbohidratos. Las opciones de accin rpida son un gel de glucosa, tabletas de glucosa, o 4 onzas (120 ml) de jugo de frutas, gaseosa comn, o leche baja en grasa.  Compruebe su nivel de glucosa en la sangre   15 minutos despus de tomar la glucosa.  Tome entre 15 y 20 gramos ms de glucosa si el nivel de glucosa en la sangre todava es de 70mg/dl o inferior.  Ingiera una comida o una colacin en el lapso de 1 hora una vez que los niveles de glucosa en la sangre vuelven a la normalidad.  Est atento a la poliuria (miccin excesiva) y la polidipsia (sensacin de mucha sed), que son los primeros signos de la hiperglucemia. El reconocimiento temprano de la hiperglucemia permite un tratamiento oportuno. Trate la hiperglucemia segn le indic su mdico.  Haga actividad fsica por lo menos 30minutos al da o como lo indique su mdico. Se recomienda que 30 minutos despus de cada comida, realice diez minutos  de actividad fsica para controlar los niveles de glucosa postprandial en la sangre.  Ajuste su dosis de insulina y la ingesta de alimentos, segn sea necesario, si inicia un nuevo ejercicio o deporte.  Siga su plan para los das de enfermedad cuando no pueda comer o beber como de costumbre.  Evite el tabaco y el alcohol.  Concurra a todas las visitas de control como se lo haya indicado el mdico.  Siga el consejo del mdico respecto a los controles prenatales y posteriores al parto (postparto), las visitas, la planificacin de las comidas, el ejercicio, los medicamentos, las vitaminas, los anlisis de sangre, otras pruebas mdicas y actividades fsicas.  Realice diariamente el cuidado de la piel y de los pies. Examine su piel y los pies diariamente para ver si tiene cortes, moretones, enrojecimiento, problemas en las uas, sangrado, ampollas o llagas.  Cepllese los dientes y encas por lo menos dos veces al da y use hilo dental al menos una vez por da. Concurra regularmente a las visitas de control con el dentista.  Programe un examen de vista durante el primer trimestre de su embarazo o como lo indique su mdico.  Comparta su plan de control de diabetes en el trabajo o en la escuela.  Mantngase al da con las vacunas.  Aprenda a manejar el estrs.  Obtenga la mayor cantidad posible de informacin sobre la diabetes y solicite ayuda siempre que sea necesario.  Obtenga informacin sobre el amamantamiento y analice esta posibilidad.  Debe controlar el nivel de azcar en la sangre de 6a 12semanas despus del parto. Esto se hace con una prueba de tolerancia a la glucosa oral (PTGO). SOLICITE ATENCIN MDICA SI:   No puede comer alimentos o beber por ms de 6 horas.  Tuvo nuseas o ha vomitado durante ms de 6 horas.  Tiene un nivel de glucosa en la sangre de 200 mg/dl y cetonas en la orina.  Presenta algn cambio en el estado mental.  Desarrolla problemas de visin.  Sufre  un dolor persistente de cabeza.  Siente dolor o molestias en la parte superior del abdomen.  Desarrolla una enfermedad grave adicional.  Tuvo diarrea durante ms de 6 horas.  Ha estado enfermo o ha tenido fiebre durante un par de das y no mejora. SOLICITE ATENCIN MDICA DE INMEDIATO SI:   Tiene dificultad para respirar.  Ya no siente los movimientos del beb.  Est sangrando o tiene flujo vaginal.  Comienza a tener contracciones o trabajo de parto prematuro. ASEGRESE DE QUE:  Comprende estas instrucciones.  Controlar su afeccin.  Recibir ayuda de inmediato si no mejora o si empeora.   Esta informacin no tiene como fin reemplazar el consejo del mdico. Asegrese de hacerle al mdico cualquier pregunta que tenga.     Document Released: 12/06/2004 Document Revised: 03/19/2014 Elsevier Interactive Patient Education 2016 [employer]  Tercer trimestre de embarazo (Third Trimester of Pregnancy) El tercer trimestre comprende desde la semana29 hasta la semana42, es decir, desde el mes7 hasta el mes9. El tercer trimestre es un perodo en el que el feto crece rpidamente. Hacia el final del noveno mes, el feto mide alrededor de 20pulgadas (45cm) de largo y pesa entre 6 y 10 libras (2,700 y 4,500kg).  CAMBIOS EN EL ORGANISMO Su organismo atraviesa por muchos cambios durante el embarazo, y estos varan de una mujer a otra.   Seguir aumentando de peso. Es de esperar que aumente entre 25 y 35libras (11 y 16kg) hacia el final del embarazo.  Podrn aparecer las primeras estras en las caderas, el abdomen y las mamas.  Puede tener necesidad de orinar con ms frecuencia porque el feto baja hacia la pelvis y ejerce presin sobre la vejiga.  Debido al embarazo podr sentir acidez estomacal con frecuencia.  Puede estar estreida, ya que ciertas hormonas enlentecen los movimientos de los msculos que empujan los desechos a travs de los intestinos.  Pueden aparecer  hemorroides o abultarse e hincharse las venas (venas varicosas).  Puede sentir dolor plvico debido al aumento de peso y a que las hormonas del embarazo relajan las articulaciones entre los huesos de la pelvis. El dolor de espalda puede ser consecuencia de la sobrecarga de los msculos que soportan la postura.  Tal vez haya cambios en el cabello que pueden incluir su engrosamiento, crecimiento rpido y cambios en la textura. Adems, a algunas mujeres se les cae el cabello durante o despus del embarazo, o tienen el cabello seco o fino. Lo ms probable es que el cabello se le normalice despus del nacimiento del beb.  Las mamas seguirn creciendo y le dolern. A veces, puede haber una secrecin amarilla de las mamas llamada calostro.  El ombligo puede salir hacia afuera.  Puede sentir que le falta el aire debido a que se expande el tero.  Puede notar que el feto "baja" o lo siente ms bajo, en el abdomen.  Puede tener una prdida de secrecin mucosa con sangre. Esto suele ocurrir en el trmino de unos pocos das a una semana antes de que comience el trabajo de parto.  El cuello del tero se vuelve delgado y blando (se borra) cerca de la fecha de parto. QU DEBE ESPERAR EN LOS EXMENES PRENATALES  Le harn exmenes prenatales cada 2semanas hasta la semana36. A partir de ese momento le harn exmenes semanales. Durante una visita prenatal de rutina:  La pesarn para asegurarse de que usted y el feto estn creciendo normalmente.  Le tomarn la presin arterial.  Le medirn el abdomen para controlar el desarrollo del beb.  Se escucharn los latidos cardacos fetales.  Se evaluarn los resultados de los estudios solicitados en visitas anteriores.  Le revisarn el cuello del tero cuando est prxima la fecha de parto para controlar si este se ha borrado. Alrededor de la semana36, el mdico le revisar el cuello del tero. Al mismo tiempo, realizar un anlisis de las secreciones del  tejido vaginal. Este examen es para determinar si hay un tipo de bacteria, estreptococo Grupo B. El mdico le explicar esto con ms detalle. El mdico puede preguntarle lo siguiente:  Cmo le gustara que fuera el parto.  Cmo se siente.  Si siente los movimientos del beb.  Si ha tenido sntomas anormales, como prdida de lquido, sangrado, dolores de cabeza intensos   o clicos abdominales.  Si est consumiendo algn producto que contenga tabaco, como cigarrillos, tabaco de mascar y cigarrillos electrnicos.  Si tiene alguna pregunta. Otros exmenes o estudios de deteccin que pueden realizarse durante el tercer trimestre incluyen lo siguiente:  Anlisis de sangre para controlar los niveles de hierro (anemia).  Controles fetales para determinar su salud, nivel de actividad y crecimiento. Si tiene alguna enfermedad o hay problemas durante el embarazo, le harn estudios.  Prueba del VIH (virus de inmunodeficiencia humana). Si corre un riesgo alto, pueden realizarle una prueba de deteccin del VIH durante el tercer trimestre del embarazo. FALSO TRABAJO DE PARTO Es posible que sienta contracciones leves e irregulares que finalmente desaparecen. Se llaman contracciones de Braxton Hicks o falso trabajo de parto. Las contracciones pueden durar horas, das o incluso semanas, antes de que el verdadero trabajo de parto se inicie. Si las contracciones ocurren a intervalos regulares, se intensifican o se hacen dolorosas, lo mejor es que la revise el mdico.  SIGNOS DE TRABAJO DE PARTO   Clicos de tipo menstrual.  Contracciones cada 5minutos o menos.  Contracciones que comienzan en la parte superior del tero y se extienden hacia abajo, a la zona inferior del abdomen y la espalda.  Sensacin de mayor presin en la pelvis o dolor de espalda.  Una secrecin de mucosidad acuosa o con sangre que sale de la vagina. Si tiene alguno de estos signos antes de la semana37 del embarazo, llame a su  mdico de inmediato. Debe concurrir al hospital para que la controlen inmediatamente. INSTRUCCIONES PARA EL CUIDADO EN EL HOGAR   Evite fumar, consumir hierbas, beber alcohol y tomar frmacos que no le hayan recetado. Estas sustancias qumicas afectan la formacin y el desarrollo del beb.  No consuma ningn producto que contenga tabaco, lo que incluye cigarrillos, tabaco de mascar y cigarrillos electrnicos. Si necesita ayuda para dejar de fumar, consulte al mdico. Puede recibir asesoramiento y otro tipo de recursos para dejar de fumar.  Siga las indicaciones del mdico en relacin con el uso de medicamentos. Durante el embarazo, hay medicamentos que son seguros de tomar y otros que no.  Haga ejercicio solamente como se lo haya indicado el mdico. Sentir clicos uterinos es un buen signo para detener la actividad fsica.  Contine comiendo alimentos sanos con regularidad.  Use un sostn que le brinde buen soporte si le duelen las mamas.  No se d baos de inmersin en agua caliente, baos turcos ni saunas.  Use el cinturn de seguridad en todo momento mientras conduce.  No coma carne cruda ni queso sin cocinar; evite el contacto con las bandejas sanitarias de los gatos y la tierra que estos animales usan. Estos elementos contienen grmenes que pueden causar defectos congnitos en el beb.  Tome las vitaminas prenatales.  Tome entre 1500 y 2000mg de calcio diariamente comenzando en la semana20 del embarazo hasta el parto.  Si est estreida, pruebe un laxante suave (si el mdico lo autoriza). Consuma ms alimentos ricos en fibra, como vegetales y frutas frescos y cereales integrales. Beba gran cantidad de lquido para mantener la orina de tono claro o color amarillo plido.  Dese baos de asiento con agua tibia para aliviar el dolor o las molestias causadas por las hemorroides. Use una crema para las hemorroides si el mdico la autoriza.  Si tiene venas varicosas, use medias de  descanso. Eleve los pies durante 15minutos, 3 o 4veces por da. Limite el consumo de sal en su dieta.  Evite   levantar objetos pesados, use zapatos de tacones bajos y mantenga una buena postura.  Descanse con las piernas elevadas si tiene calambres o dolor de cintura.  Visite a su dentista si no lo ha hecho durante el embarazo. Use un cepillo de dientes blando para higienizarse los dientes y psese el hilo dental con suavidad.  Puede seguir manteniendo relaciones sexuales, a menos que el mdico le indique lo contrario.  No haga viajes largos excepto que sea absolutamente necesario y solo con la autorizacin del mdico.  Tome clases prenatales para entender, practicar y hacer preguntas sobre el trabajo de parto y el parto.  Haga un ensayo de la partida al hospital.  Prepare el bolso que llevar al hospital.  Prepare la habitacin del beb.  Concurra a todas las visitas prenatales segn las indicaciones de su mdico. SOLICITE ATENCIN MDICA SI:  No est segura de que est en trabajo de parto o de que ha roto la bolsa de las aguas.  Tiene mareos.  Siente clicos leves, presin en la pelvis o dolor persistente en el abdomen.  Tiene nuseas, vmitos o diarrea persistentes.  Observa una secrecin vaginal con mal olor.  Siente dolor al orinar. SOLICITE ATENCIN MDICA DE INMEDIATO SI:   Tiene fiebre.  Tiene una prdida de lquido por la vagina.  Tiene sangrado o pequeas prdidas vaginales.  Siente dolor intenso o clicos en el abdomen.  Sube o baja de peso rpidamente.  Tiene dificultad para respirar y siente dolor de pecho.  Sbitamente se le hinchan mucho el rostro, las manos, los tobillos, los pies o las piernas.  No ha sentido los movimientos del beb durante una hora.  Siente un dolor de cabeza intenso que no se alivia con medicamentos.  Su visin se modifica.   Esta informacin no tiene como fin reemplazar el consejo del mdico. Asegrese de hacerle al mdico  cualquier pregunta que tenga.   Document Released: 12/06/2004 Document Revised: 03/19/2014 Elsevier Interactive Patient Education 2016 [employer]  Lactancia materna (Breastfeeding) Decidir amamantar es una de las mejores elecciones que puede hacer por usted y su beb. El cambio hormonal durante el embarazo produce el desarrollo del tejido mamario y aumenta la cantidad y el tamao de los conductos galactforos. Estas hormonas tambin permiten que las protenas, los azcares y las grasas de la sangre produzcan la leche materna en las glndulas productoras de leche. Las hormonas impiden que la leche materna sea liberada antes del nacimiento del beb, adems de impulsar el flujo de leche luego del nacimiento. Una vez que ha comenzado a amamantar, pensar en el beb, as como la succin o el llanto, pueden estimular la liberacin de leche de las glndulas productoras de leche.  LOS BENEFICIOS DE AMAMANTAR Para el beb  La primera leche (calostro) ayuda a mejorar el funcionamiento del sistema digestivo del beb.  La leche tiene anticuerpos que ayudan a prevenir las infecciones en el beb.  El beb tiene una menor incidencia de asma, alergias y del sndrome de muerte sbita del lactante.  Los nutrientes en la leche materna son mejores para el beb que la leche maternizada y estn preparados exclusivamente para cubrir las necesidades del beb.  La leche materna mejora el desarrollo cerebral del beb.  Es menos probable que el beb desarrolle otras enfermedades, como obesidad infantil, asma o diabetes mellitus de tipo 2. Para usted   La lactancia materna favorece el desarrollo de un vnculo muy especial entre la madre y el beb.  Es conveniente. La leche materna   siempre est disponible a la temperatura correcta y es econmica.  La lactancia materna ayuda a quemar caloras y a perder el peso ganado durante el embarazo.  Favorece la contraccin del tero al tamao que tena antes del embarazo  de manera ms rpida y disminuye el sangrado (loquios) despus del parto.  La lactancia materna contribuye a reducir el riesgo de desarrollar diabetes mellitus de tipo 2, osteoporosis o cncer de mama o de ovario en el futuro. SIGNOS DE QUE EL BEB EST HAMBRIENTO Primeros signos de hambre  Aumenta su estado de alerta o actividad.  Se estira.  Mueve la cabeza de un lado a otro.  Mueve la cabeza y abre la boca cuando se le toca la mejilla o la comisura de la boca (reflejo de bsqueda).  Aumenta las vocalizaciones, tales como sonidos de succin, se relame los labios, emite arrullos, suspiros, o chirridos.  Mueve la mano hacia la boca.  Se chupa con ganas los dedos o las manos. Signos tardos de hambre  Est agitado.  Llora de manera intermitente. Signos de hambre extrema Los signos de hambre extrema requerirn que lo calme y lo consuele antes de que el beb pueda alimentarse adecuadamente. No espere a que se manifiesten los siguientes signos de hambre extrema para comenzar a amamantar:   Agitacin.  Llanto intenso y fuerte.  Gritos. INFORMACIN BSICA SOBRE LA LACTANCIA MATERNA Iniciacin de la lactancia materna  Encuentre un lugar cmodo para sentarse o acostarse, con un buen respaldo para el cuello y la espalda.  Coloque una almohada o una manta enrollada debajo del beb para acomodarlo a la altura de la mama (si est sentada). Las almohadas para amamantar se han diseado especialmente a fin de servir de apoyo para los brazos y el beb mientras amamanta.  Asegrese de que el abdomen del beb est frente al suyo.   Masajee suavemente la mama. Con las yemas de los dedos, masajee la pared del pecho hacia el pezn en un movimiento circular. Esto estimula el flujo de leche. Es posible que deba continuar este movimiento mientras amamanta si la leche fluye lentamente.  Sostenga la mama con el pulgar por arriba del pezn y los otros 4 dedos por debajo de la mama. Asegrese de que  los dedos se encuentren lejos del pezn y de la boca del beb.  Empuje suavemente los labios del beb con el pezn o con el dedo.  Cuando la boca del beb se abra lo suficiente, acrquelo rpidamente a la mama e introduzca todo el pezn y la zona oscura que lo rodea (areola), tanto como sea posible, dentro de la boca del beb.  Debe haber ms areola visible por arriba del labio superior del beb que por debajo del labio inferior.  La lengua del beb debe estar entre la enca inferior y la mama.  Asegrese de que la boca del beb est en la posicin correcta alrededor del pezn (prendida). Los labios del beb deben crear un sello sobre la mama y estar doblados hacia afuera (invertidos).  Es comn que el beb succione durante 2 a 3 minutos para que comience el flujo de leche materna. Cmo debe prenderse Es muy importante que le ensee al beb cmo prenderse adecuadamente a la mama. Si el beb no se prende adecuadamente, puede causarle dolor en el pezn y reducir la produccin de leche materna, y hacer que el beb tenga un escaso aumento de peso. Adems, si el beb no se prende adecuadamente al pezn, puede tragar aire durante   la alimentacin. Esto puede causarle molestias al beb. Hacer eructar al beb al cambiar de mama puede ayudarlo a liberar el aire. Sin embargo, ensearle al beb cmo prenderse a la mama adecuadamente es la mejor manera de evitar que se sienta molesto por tragar aire mientras se alimenta. Signos de que el beb se ha prendido adecuadamente al pezn:   Tironea o succiona de modo silencioso, sin causarle dolor.  Se escucha que traga cada 3 o 4 succiones.  Hay movimientos musculares por arriba y por delante de sus odos al succionar. Signos de que el beb no se ha prendido adecuadamente al pezn:   Hace ruidos de succin o de chasquido mientras se alimenta.  Siente dolor en el pezn. Si cree que el beb no se prendi correctamente, deslice el dedo en la comisura de la boca  y colquelo entre las encas del beb para interrumpir la succin. Intente comenzar a amamantar nuevamente. Signos de lactancia materna exitosa Signos del beb:   Disminuye gradualmente el nmero de succiones o cesa la succin por completo.  Se duerme.  Relaja el cuerpo.  Retiene una pequea cantidad de leche en la boca.  Se desprende solo del pecho. Signos que presenta usted:  Las mamas han aumentado la firmeza, el peso y el tamao 1 a 3 horas despus de amamantar.  Estn ms blandas inmediatamente despus de amamantar.  Un aumento del volumen de leche, y tambin un cambio en su consistencia y color se producen hacia el quinto da de lactancia materna.  Los pezones no duelen, ni estn agrietados ni sangran. Signos de que su beb recibe la cantidad de leche suficiente  Moja al menos 3 paales en 24 horas. La orina debe ser clara y de color amarillo plido a los 5 das de vida.  Defeca al menos 3 veces en 24 horas a los 5 das de vida. La materia fecal debe ser blanda y amarillenta.  Defeca al menos 3 veces en 24 horas a los 7 das de vida. La materia fecal debe ser grumosa y amarillenta.  No registra una prdida de peso mayor del 10% del peso al nacer durante los primeros 3 das de vida.  Aumenta de peso un promedio de 4 a 7onzas (113 a 198g) por semana despus de los 4 das de vida.  Aumenta de peso, diariamente, de manera uniforme a partir de los 5 das de vida, sin registrar prdida de peso despus de las 2semanas de vida. Despus de alimentarse, es posible que el beb regurgite una pequea cantidad. Esto es frecuente. FRECUENCIA Y DURACIN DE LA LACTANCIA MATERNA El amamantamiento frecuente la ayudar a producir ms leche y a prevenir problemas de dolor en los pezones e hinchazn en las mamas. Alimente al beb cuando muestre signos de hambre o si siente la necesidad de reducir la congestin de las mamas. Esto se denomina "lactancia a demanda". Evite el uso del chupete  mientras trabaja para establecer la lactancia (las primeras 4 a 6 semanas despus del nacimiento del beb). Despus de este perodo, podr ofrecerle un chupete. Las investigaciones demostraron que el uso del chupete durante el primer ao de vida del beb disminuye el riesgo de desarrollar el sndrome de muerte sbita del lactante (SMSL). Permita que el nio se alimente en cada mama todo lo que desee. Contine amamantando al beb hasta que haya terminado de alimentarse. Cuando el beb se desprende o se queda dormido mientras se est alimentando de la primera mama, ofrzcale la segunda. Debido a que,   con frecuencia, los recin nacidos permanecen somnolientos las primeras semanas de vida, es posible que deba despertar al beb para alimentarlo. Los horarios de lactancia varan de un beb a otro. Sin embargo, las siguientes reglas pueden servir como gua para ayudarla a garantizar que el beb se alimenta adecuadamente:  Se puede amamantar a los recin nacidos (bebs de 4 semanas o menos de vida) cada 1 a 3 horas.  No deben transcurrir ms de 3 horas durante el da o 5 horas durante la noche sin que se amamante a los recin nacidos.  Debe amamantar al beb 8 veces como mnimo en un perodo de 24 horas, hasta que comience a introducir slidos en su dieta, a los 6 meses de vida aproximadamente. EXTRACCIN DE LECHE MATERNA La extraccin y el almacenamiento de la leche materna le permiten asegurarse de que el beb se alimente exclusivamente de leche materna, aun en momentos en los que no puede amamantar. Esto tiene especial importancia si debe regresar al trabajo en el perodo en que an est amamantando o si no puede estar presente en los momentos en que el beb debe alimentarse. Su asesor en lactancia puede orientarla sobre cunto tiempo es seguro almacenar leche materna.  El sacaleche es un aparato que le permite extraer leche de la mama a un recipiente estril. Luego, la leche materna extrada puede almacenarse  en un refrigerador o congelador. Algunos sacaleches son manuales, mientras que otros son elctricos. Consulte a su asesor en lactancia qu tipo ser ms conveniente para usted. Los sacaleches se pueden comprar; sin embargo, algunos hospitales y grupos de apoyo a la lactancia materna alquilan sacaleches mensualmente. Un asesor en lactancia puede ensearle cmo extraer leche materna manualmente, en caso de que prefiera no usar un sacaleche.  CMO CUIDAR LAS MAMAS DURANTE LA LACTANCIA MATERNA Los pezones se secan, agrietan y duelen durante la lactancia materna. Las siguientes recomendaciones pueden ayudarla a mantener las mamas humectadas y sanas:  Evite usar jabn en los pezones.  Use un sostn de soporte. Aunque no son esenciales, las camisetas sin mangas o los sostenes especiales para amamantar estn diseados para acceder fcilmente a las mamas, para amamantar sin tener que quitarse todo el sostn o la camiseta. Evite usar sostenes con aro o sostenes muy ajustados.  Seque al aire sus pezones durante 3 a 4minutos despus de amamantar al beb.  Utilice solo apsitos de algodn en el sostn para absorber las prdidas de leche. La prdida de un poco de leche materna entre las tomas es normal.  Utilice lanolina sobre los pezones luego de amamantar. La lanolina ayuda a mantener la humedad normal de la piel. Si usa lanolina pura, no tiene que lavarse los pezones antes de volver a alimentar al beb. La lanolina pura no es txica para el beb. Adems, puede extraer manualmente algunas gotas de leche materna y masajear suavemente esa leche sobre los pezones, para que la leche se seque al aire. Durante las primeras semanas despus de dar a luz, algunas mujeres pueden experimentar hinchazn en las mamas (congestin mamaria). La congestin puede hacer que sienta las mamas pesadas, calientes y sensibles al tacto. El pico de la congestin ocurre dentro de los 3 a 5 das despus del parto. Las siguientes  recomendaciones pueden ayudarla a aliviar la congestin:  Vace por completo las mamas al amamantar o extraer leche. Puede aplicar calor hmedo en las mamas (en la ducha o con toallas hmedas para manos) antes de amamantar o extraer leche. Esto aumenta la circulacin y   ayuda a que la leche fluya. Si el beb no vaca por completo las mamas cuando lo amamanta, extraiga la leche restante despus de que haya finalizado.  Use un sostn ajustado (para amamantar o comn) o una camiseta sin mangas durante 1 o 2 das para indicar al cuerpo que disminuya ligeramente la produccin de leche.  Aplique compresas de hielo sobre las mamas, a menos que le resulte demasiado incmodo.  Asegrese de que el beb est prendido y se encuentre en la posicin correcta mientras lo alimenta. Si la congestin persiste luego de 48 horas o despus de seguir estas recomendaciones, comunquese con su mdico o un asesor en lactancia. RECOMENDACIONES GENERALES PARA EL CUIDADO DE LA SALUD DURANTE LA LACTANCIA MATERNA  Consuma alimentos saludables. Alterne comidas y colaciones, y coma 3 de cada una por da. Dado que lo que come afecta la leche materna, es posible que algunas comidas hagan que su beb se vuelva ms irritable de lo habitual. Evite comer este tipo de alimentos si percibe que afectan de manera negativa al beb.  Beba leche, jugos de fruta y agua para satisfacer su sed (aproximadamente 10 vasos al da).  Descanse con frecuencia, reljese y tome sus vitaminas prenatales para evitar la fatiga, el estrs y la anemia.  Contine con los autocontroles de la mama.  Evite masticar y fumar tabaco. Las sustancias qumicas de los cigarrillos que pasan a la leche materna y la exposicin al humo ambiental del tabaco pueden daar al beb.  No consuma alcohol ni drogas, incluida la marihuana. Algunos medicamentos, que pueden ser perjudiciales para el beb, pueden pasar a travs de la leche materna. Es importante que consulte a su  mdico antes de tomar cualquier medicamento, incluidos todos los medicamentos recetados y de venta libre, as como los suplementos vitamnicos y herbales. Puede quedar embarazada durante la lactancia. Si desea controlar la natalidad, consulte a su mdico cules son las opciones ms seguras para el beb. SOLICITE ATENCIN MDICA SI:   Usted siente que quiere dejar de amamantar o se siente frustrada con la lactancia.  Siente dolor en las mamas o en los pezones.  Sus pezones estn agrietados o sangran.  Sus pechos estn irritados, sensibles o calientes.  Tiene un rea hinchada en cualquiera de las mamas.  Siente escalofros o fiebre.  Tiene nuseas o vmitos.  Presenta una secrecin de otro lquido distinto de la leche materna de los pezones.  Sus mamas no se llenan antes de amamantar al beb para el quinto da despus del parto.  Se siente triste y deprimida.  El beb est demasiado somnoliento como para comer bien.  El beb tiene problemas para dormir.  Moja menos de 3 paales en 24 horas.  Defeca menos de 3 veces en 24 horas.  La piel del beb o la parte blanca de los ojos se vuelven amarillentas.  El beb no ha aumentado de peso a los 5 das de vida. SOLICITE ATENCIN MDICA DE INMEDIATO SI:   El beb est muy cansado (letargo) y no se quiere despertar para comer.  Le sube la fiebre sin causa.   Esta informacin no tiene como fin reemplazar el consejo del mdico. Asegrese de hacerle al mdico cualquier pregunta que tenga.   Document Released: 02/26/2005 Document Revised: 11/17/2014 Elsevier Interactive Patient Education 2016 Elsevier Inc.  

## 2015-01-24 NOTE — Progress Notes (Signed)
Growth U/S with [facility] 02/08/15 @ 1p.  Spanish interpreter Elna BreslowCarol Hernandez present.

## 2015-01-24 NOTE — Progress Notes (Signed)
Subjective:  Elizabeth [name] is a 29 y.o. G1P0 at 3131w6d being seen today for ongoing prenatal care.  Patient reports no complaints.  Contractions: Irregular.   . Movement: Present. Denies leaking of fluid.   The following portions of the patient's history were reviewed and updated as appropriate: allergies, current medications, past family history, past medical history, past social history, past surgical history and problem list. Problem list updated.  Objective:   Filed Vitals:   01/24/15 1130  BP: 115/68  Pulse: 87  Temp: 98.9 F (37.2 C)  Weight: 146 lb 6.4 oz (66.407 kg)    Fetal Status: Fetal Heart Rate (bpm): 162 Fundal Height: 35 cm Movement: Present  Presentation: Vertex  General:  Alert, oriented and cooperative. Patient is in no acute distress.  Skin: Skin is warm and dry. No rash noted.   Cardiovascular: Normal heart rate noted  Respiratory: Normal respiratory effort, no problems with respiration noted  Abdomen: Soft, gravid, appropriate for gestational age. Pain/Pressure: Present     Pelvic:       Cervical exam deferred        Extremities: Normal range of motion.  Edema: None  Mental Status: Normal mood and affect. Normal behavior. Normal judgment and thought content.   Urinalysis: Urine Protein: Negative Urine Glucose: 2+ No book today FBS 73-93 2 hours pp 73-163 ( only one value out per pt) Assessment and Plan:  Pregnancy: G1P0 at 2331w6d  1. Gestational diabetes mellitus, unspecified diabetic control, unspecified trimester Diet controlled - US MFM OB COMP + 14 WK; Future - US MFM OB FOLLOW UP; Future - US MFM OB COMP + 14 WK; Future  2. Supervision of high-risk pregnancy, third trimester Continue routine prenatal care. Cultures next visit  Term labor symptoms and general obstetric precautions including but not limited to vaginal bleeding, contractions, leaking of fluid and fetal movement were reviewed in detail with the patient. Please refer to  After Visit Summary for other counseling recommendations.  Return in 1 week (on 01/31/2015).   Reva Boresanya S Raianna Slight, MD

## 2015-01-25 LAB — URINE CYTOLOGY ANCILLARY ONLY
Chlamydia: NEGATIVE
Neisseria Gonorrhea: NEGATIVE

## 2015-01-28 ENCOUNTER — Encounter (HOSPITAL_COMMUNITY): Payer: Self-pay

## 2015-01-28 ENCOUNTER — Inpatient Hospital Stay (HOSPITAL_COMMUNITY)
Admission: AD | Admit: 2015-01-28 | Discharge: 2015-01-28 | Disposition: A | Discharge status: Home / Self Care | Payer: Self-pay | Source: Ambulatory Visit | Attending: Obstetrics and Gynecology | Admitting: Obstetrics and Gynecology

## 2015-01-28 DIAGNOSIS — R102 Pelvic and perineal pain: Secondary | ICD-10-CM | POA: Insufficient documentation

## 2015-01-28 DIAGNOSIS — B3731 Acute candidiasis of vulva and vagina: Secondary | ICD-10-CM

## 2015-01-28 DIAGNOSIS — B373 Candidiasis of vulva and vagina: Secondary | ICD-10-CM

## 2015-01-28 DIAGNOSIS — Z3A36 36 weeks gestation of pregnancy: Secondary | ICD-10-CM | POA: Insufficient documentation

## 2015-01-28 DIAGNOSIS — O26893 Other specified pregnancy related conditions, third trimester: Secondary | ICD-10-CM

## 2015-01-28 DIAGNOSIS — N898 Other specified noninflammatory disorders of vagina: Secondary | ICD-10-CM

## 2015-01-28 DIAGNOSIS — O98813 Other maternal infectious and parasitic diseases complicating pregnancy, third trimester: Secondary | ICD-10-CM

## 2015-01-28 LAB — WET PREP, GENITAL
CLUE CELLS WET PREP: NONE SEEN
Sperm: NONE SEEN
TRICH WET PREP: NONE SEEN

## 2015-01-28 MED ORDER — TERCONAZOLE 0.4 % VA CREA: 1.0000 | TOPICAL_CREAM | Freq: Every day | VAGINAL | Status: DC

## 2015-01-28 NOTE — Discharge Instructions (Signed)
Vaginitis moniliásica  (Monilial Vaginitis)  La vaginitis es una inflamación (irritación, hinchazón) de la vagina y la vulva. Esta no es una enfermedad de transmisión sexual.   CAUSAS  Este tipo de vaginitis lo causa un hongo (candida) que normalmente se encuentra en la vagina. El hongo candida se ha desarrollado hasta el punto de ocasionar problemas en el equilibrio químico.  SÍNTOMAS  · Secreción vaginal espesa y blanca.  · Hinchazón, picazón, enrojecimiento e inflamación de la vagina y en algunos casos de los labios vaginales (vulva).  · Ardor o dolor al orinar.  · Dolor en las relaciones sexuales.  DIAGNÓSTICO  Los factores que favorecen la vaginitis moniliasica son:  · Etapas de virginidad y postmenopáusicas.  · Embarazo.  · Infecciones.  · Sentir cansancio, estar enferma o estresada, especialmente si ya ha sufrido este problema en el pasado.  · Diabetes Buen control ayudará a disminuír la probabilidad.  · Píldoras anticonceptivas  · Ropa interior muy ajustada.  · El uso de espumas de baño, aerosoles femeninos duchas vaginales o tampones con desodorante.  · Algunos antibióticos (medicamentos que destruyen gérmenes).  · Si contrae alguna enfermedad puede sufrir recurrencias esporádicas.  TRATAMIENTO  El profesional que lo asiste prescribirá medicamentos.  · Hay diferentes tipos de cremas y supositorios vaginales que tratan específicamente la vaginitis moniliásica. Para infecciones por hongos recurrentes, utilice un supositorio o crema en la vagina dos veces por semana, o según se le indique.  · También podrán utilizarse cremas con corticoides o anti moniliásicas para la picazón o la irritación de la vulva. Consulte con el profesional que la asiste.  · Si la crema no da resultado, podrá aplicarse en la vagina una solución con azul de metileno.  · El consumo de yogur puede prevenir este tipo de vaginitis.  INSTRUCCIONES PARA EL CUIDADO DOMICILIARIO  · Tome todos los medicamentos tal como se le indicó.  · No  mantenga relaciones sexuales hasta que el tratamiento se haya completado, o según las indicaciones del profesional que la asiste.  · Tome baños de asiento tibios.  · No se aplique duchas vaginales.  · No utilice tampones, especialmente los perfumados.  · Use ropa interior de algodón  · Evite los pantalones ajustados y las medias tipo panty.  · Comunique a sus compañeros sexuales que sufre una infección por hongos. Ellos deben concurrir para un control médico si tienen síntomas como una urticaria leve o picazón.  · Sus compañeros sexuales deben tratarse también si la infección es difícil de eliminar.  · Practique el sexo seguro - use condones  · Algunos medicamentos vaginales ocasionan fallas en los condones de látex. Los medicamentos vaginales que pueden dañar los condones son:  ¨ Crema cleocina  ¨ Butoconazole (Femstat®)  ¨ Terconazole (Terazol®) supositorios vaginales  ¨ Miconazole (Monistat®) (es un medicamento de venta libre)  SOLICITE ATENCIÓN MÉDICA SI:  · Usted tiene una temperatura oral de más de 38,9° C (102° F).  · Si la infección empeora luego de 2 días de tratamiento.  · Si la infección no mejora luego de 3 días de tratamiento.  · Aparecen ampollas en o alrededor de la vagina.  · Si aparece una hemorragia vaginal y no es el momento del período.  · Siente dolor al orinar.  · Presenta problemas intestinales.  · Tiene dolor durante las relaciones sexuales.     Esta información no tiene como fin reemplazar el consejo del médico. Asegúrese de hacerle al médico cualquier pregunta que tenga.       Document Released: 12/06/2004 Document Revised: 05/21/2011  Elsevier Interactive Patient Education ©2016 Elsevier Inc.

## 2015-01-28 NOTE — MAU Provider Note (Signed)
History     CSN: [id number]  Arrival date and time: 01/28/15 1532   None     Chief Complaint  Patient presents with  . Vaginal Discharge   HPI  [facility][name] Suzan NailerCruz Luis is a 29 y.o. G1P0 at 7456w3d who presents with one week of vaginal discharge. She describes the discharge as a dark yellow with an odor. She also has a lot of itching in her vaginal area. She had a different vaginal infection 3 weeks ago that she was treated for. She says that infection had a clear white discharge. She is worried because she recently had sexual relations with her partner and he described penile itching afterwards.  She endorses pelvic pain that lasts throughout the day. She also endorses tightening of her stomach that occurs 2-3 times an hour. She denies vaginal bleeding. She is still feeling baby move. She denies any leaking of fluid or gush of fluid. She denies changes in urination (hematuria, dysuria, frequency) and denies diarrhea, constipation, nausea, or vomiting.   OB History    Gravida Para Term Preterm AB TAB SAB Ectopic Multiple Living   1               Past Medical History  Diagnosis Date  . Gestational diabetes 12/09/2014    Past Surgical History  Procedure Laterality Date  . Hand surgery      Family History  Problem Relation Age of Onset  . Hypertension Mother   . Hyperlipidemia Mother   . Diabetes Father     Social History  Substance Use Topics  . Smoking status: Never Smoker   . Smokeless tobacco: Never Used  . Alcohol Use: Yes    Allergies: No Known Allergies  Prescriptions prior to admission  Medication Sig Dispense Refill Last Dose  . Prenatal Vit-Fe Fumarate-FA (GNP PRENATAL VITAMINS) 28-0.8 MG TABS Take 1 capsule by mouth daily. 90 tablet 3 Taking    Review of Systems  Eyes: Negative for blurred vision and double vision.  Cardiovascular: Negative for chest pain and palpitations.  Gastrointestinal: Negative for nausea, vomiting, abdominal pain, diarrhea and  constipation.  Genitourinary: Negative for dysuria, urgency, frequency and hematuria.  Musculoskeletal: Positive for back pain.  Neurological: Positive for headaches.   Physical Exam   Blood pressure 113/65, pulse 89, temperature 98.4 F (36.9 C), temperature source Oral, resp. rate 18, height 5' (1.524 m), weight 146 lb (66.225 kg), last menstrual period 05/18/2014.  Physical Exam  Constitutional: She is oriented to person, place, and time. She appears well-developed and well-nourished. No distress.  Cardiovascular: Normal rate.   Respiratory: Effort normal. No respiratory distress.  GI: Soft. She exhibits mass.  Gravid  Genitourinary: There is no rash, tenderness, lesion or injury on the right labia. There is no rash, tenderness, lesion or injury on the left labia. Cervix exhibits discharge. Cervix exhibits no friability. Vaginal discharge found.  White discharge covering outer labia and copious white discharge within vagina and around cervix  Neurological: She is alert and oriented to person, place, and time.  Skin: She is not diaphoretic.  Psychiatric: She has a normal mood and affect. Her behavior is normal.   Results for orders placed or performed during the hospital encounter of 01/28/15 (from the past 24 hour(s))  Wet prep, genital     Status: Abnormal   Collection Time: 01/28/15  5:09 PM  Result Value Ref Range   Yeast Wet Prep HPF POC PRESENT (A) NONE SEEN   Trich, Wet Prep NONE  SEEN NONE SEEN   Clue Cells Wet Prep HPF POC NONE SEEN NONE SEEN   WBC, Wet Prep HPF POC MANY (A) NONE SEEN   Sperm NONE SEEN    Fetal Tracing: Baseline:135 Variability:mod Accelerations: 15x15 Decelerations:none Toco:irritability  MAU Course  Procedures  MDM Yeast on wet prep Reactive NST  Assessment and Plan  A:  1. Vaginal discharge during pregnancy in third trimester   2. Candidiasis of vagina    P: Discharge to home [facility]azole x 1 week Husband to HD for eval Increase  fluids Keep f/u appt for [facility] Labor precautions Patient may return to MAU as needed or if her condition were to change or worsen   Bertram Denver 01/28/2015, 5:13 PM

## 2015-01-28 NOTE — MAU Note (Signed)
Thinks she has a vaginal infection, sees a yellow d/c and is itching

## 2015-01-28 NOTE — MAU Note (Signed)
Urine sent to lab 

## 2015-01-31 ENCOUNTER — Encounter: Payer: Self-pay | Admitting: Family Medicine

## 2015-01-31 ENCOUNTER — Ambulatory Visit (INDEPENDENT_AMBULATORY_CARE_PROVIDER_SITE_OTHER): Payer: Medicaid Other | Admitting: Family Medicine

## 2015-01-31 VITALS — BP 116/67 | HR 94 | Temp 98.4°F | Wt 146.5 lb

## 2015-01-31 DIAGNOSIS — O0993 Supervision of high risk pregnancy, unspecified, third trimester: Secondary | ICD-10-CM

## 2015-01-31 DIAGNOSIS — O24410 Gestational diabetes mellitus in pregnancy, diet controlled: Secondary | ICD-10-CM

## 2015-01-31 LAB — GC/CHLAMYDIA PROBE AMP (~~LOC~~) NOT AT ARMC
Chlamydia: NEGATIVE
NEISSERIA GONORRHEA: NEGATIVE

## 2015-01-31 LAB — POCT URINALYSIS DIP (DEVICE)
BILIRUBIN URINE: NEGATIVE
Glucose, UA: NEGATIVE mg/dL
Ketones, ur: NEGATIVE mg/dL
NITRITE: NEGATIVE
Protein, ur: 30 mg/dL — AB
Specific Gravity, Urine: >=1.03 (ref 1.005–1.030)
UROBILINOGEN UA: 0.2 mg/dL (ref 0.0–1.0)
pH: 6.5 (ref 5.0–8.0)

## 2015-01-31 NOTE — Progress Notes (Signed)
Spanish interpreter: Elizabeth FritzBlanca used Subjective:  Elizabeth ShoutsGrecia Rochelle Suzan NailerCruz [name] is a 29 y.o. G1P0 at 1926w6d being seen today for ongoing prenatal care.  She is currently monitored for the following issues for this high-risk pregnancy: Patient Active Problem List   Diagnosis Date Noted  . Low-lying placenta 12/27/2014  . Gestational diabetes 12/09/2014  . GERD (gastroesophageal reflux disease) 11/29/2014  . Supervision of high-risk pregnancy 08/27/2014   Patient reports no complaints.  Contractions: Irregular. Vag. Bleeding: None.  Movement: Present. Denies leaking of fluid.   The following portions of the patient's history were reviewed and updated as appropriate: allergies, current medications, past family history, past medical history, past social history, past surgical history and problem list. Problem list updated.  Objective:   Filed Vitals:   01/31/15 0901  BP: 116/67  Pulse: 94  Temp: 98.4 F (36.9 C)  Weight: 146 lb 8 oz (66.452 kg)    Fetal Status: Fetal Heart Rate (bpm): 140   Movement: Present     General:  Alert, oriented and cooperative. Patient is in no acute distress.  Skin: Skin is warm and dry. No rash noted.   Cardiovascular: Normal heart rate noted  Respiratory: Normal respiratory effort, no problems with respiration noted  Abdomen: Soft, gravid, appropriate for gestational age. Pain/Pressure: Present     Pelvic: Vag. Bleeding: None     Cervical exam performed      0.5/thick/ -3  Extremities: Normal range of motion.  Edema: Trace  Mental Status: Normal mood and affect. Normal behavior. Normal judgment and thought content.   Urinalysis:    prot 1+, gluc neg, large leukocytes, moderate hgb FBS 79-109 (2 out of range) 2 hour pp 82-142 (2 out of range) Assessment and Plan:  Pregnancy: G1P0 at 126w6d  1. Diet controlled gestational diabetes mellitus in third trimester BS look good U/s for growth in 8 days  2. Supervision of high-risk pregnancy, third  trimester Continue prenatal care. Desires [facility] - has attended class  3. Hematuria Urine culture - Culture, beta strep (group b only)  Term labor symptoms and general obstetric precautions including but not limited to vaginal bleeding, contractions, leaking of fluid and fetal movement were reviewed in detail with the patient. Please refer to After Visit Summary for other counseling recommendations.  Return in 1 week (on 02/07/2015).   Reva Boresanya S Pratt, MD

## 2015-01-31 NOTE — Progress Notes (Signed)
Elizabeth FritzBlanca used for interpreter  Reviewed tip of week with patient  Large leuks on UA

## 2015-01-31 NOTE — Patient Instructions (Addendum)
Tercer trimestre de Media planner (Third Trimester of Pregnancy) El tercer trimestre comprende desde la [id number] hasta la [id number], es decir, desde el mes7 hasta el mes9. El tercer trimestre es un perodo en el que el feto crece rpidamente. Hacia el final del noveno mes, el feto mide alrededor de 20pulgadas (45cm) de largo y pesa entre 6 y 67 libras (2,700 y 36,500kg).  CAMBIOS EN EL ORGANISMO Su organismo atraviesa por muchos cambios durante el [place], y estos varan de Ardelia Mems mujer a Theatre manager.   Seguir [employer] Family Insurance. Es de esperar que aumente entre 25 y 35libras (3 y 16kg) hacia el final del Media planner.  Podrn aparecer las primeras Apache Corporation caderas, el abdomen y las [place].  Puede tener necesidad de Garment/textile technologist con ms frecuencia porque el feto baja hacia la pelvis y ejerce presin sobre la vejiga.  Debido al Glennis Brink podr sentir Victorio Palm estomacal con frecuencia.  Puede estar estreida, ya que ciertas hormonas enlentecen los movimientos de los msculos que JPMorgan Chase & Co desechos a travs de los intestinos.  Pueden aparecer hemorroides o abultarse e hincharse las venas (venas varicosas).  Puede sentir dolor plvico debido al Medtronic y a que las hormonas del Scientist, research (life sciences) las articulaciones entre los huesos de la pelvis. El dolor de espalda puede ser consecuencia de la sobrecarga de los msculos que soportan la [place].  Tal vez haya cambios en el cabello que pueden incluir su engrosamiento, crecimiento rpido y cambios en la textura. Adems, a algunas mujeres se les cae el cabello durante o despus del embarazo, o tienen el cabello seco o fino. Lo ms probable es que el cabello se le normalice despus del nacimiento del beb.  Las Lincoln National Corporation seguirn creciendo y Teaching laboratory technician. A veces, puede haber una secrecin amarilla de las mamas llamada calostro.  El ombligo puede salir hacia afuera.  Puede sentir que le falta el aire debido a que se expande el tero.  Puede notar que el feto  "baja" o lo siente ms bajo, en el abdomen.  Puede tener una prdida de secrecin mucosa con sangre. Esto suele ocurrir en el trmino de unos pocos das a una semana antes de que comience el [place] de [place].  El cuello del tero se vuelve delgado y blando (se borra) cerca de la fecha de [place]. QU DEBE ESPERAR EN LOS EXMENES PRENATALES  Le harn exmenes prenatales cada 2semanas hasta la semana36. A partir de ese momento le harn exmenes semanales. Durante una visita prenatal de rutina:  La pesarn para asegurarse de que usted y el feto estn creciendo normalmente.  Le tomarn la presin arterial.  Le medirn el abdomen para controlar el desarrollo del beb.  Se escucharn los latidos cardacos fetales.  Se evaluarn los resultados de los estudios solicitados en visitas anteriores.  Le revisarn el cuello del tero cuando est prxima la fecha de parto para controlar si este se ha borrado. Alrededor de la semana36, el mdico le revisar el cuello del tero. Al mismo tiempo, realizar un anlisis de las secreciones del tejido vaginal. Este examen es para determinar si hay un tipo de bacteria, estreptococo Grupo B. El mdico le explicar esto con ms detalle. El mdico puede preguntarle lo siguiente:  Cmo le gustara que fuera el [place].  Cmo se siente.  Si siente los movimientos del beb.  Si ha tenido sntomas anormales, como prdida de lquido, [place], dolores de cabeza intensos o clicos abdominales.  Si est consumiendo algn producto que contenga tabaco, como cigarrillos, tabaco  de Higher education careers adviser y Psychologist, sport and exercise.  Si tiene Sunoco. Otros exmenes o estudios de deteccin que pueden realizarse durante el tercer trimestre incluyen lo siguiente:  Anlisis de sangre para controlar los niveles de hierro (anemia).  Controles fetales para determinar su salud, nivel de [place] y Mining engineer. Si tiene [place] enfermedad o hay problemas durante el embarazo, le harn  estudios.  Prueba del VIH (virus de inmunodeficiencia humana). Si corre Electronics engineer, pueden realizarle una prueba de deteccin del VIH durante el tercer trimestre del embarazo. FALSO TRABAJO DE PARTO Es posible que sienta contracciones leves e irregulares que finalmente desaparecen. Se llaman contracciones de Braxton Hicks o falso trabajo de [place]. Las Yahoo pueden durar horas, das o incluso semanas, antes de que el verdadero trabajo de parto se inicie. Si las contracciones ocurren a intervalos regulares, se intensifican o se hacen dolorosas, lo mejor es que la revise el mdico.  SIGNOS DE TRABAJO DE PARTO   Clicos de tipo menstrual.  Contracciones cada 48mnutos o menos.  Contracciones que comienzan en la parte superior del tero y se extienden hacia abajo, a la zona inferior del abdomen y la espalda.  Sensacin de mayor presin en la pelvis o dolor de espalda.  Una secrecin de mucosidad acuosa o con sangre que sale de la vagina. Si tiene alguno de estos signos antes de la [id number] eMedia planner llame a su mdico de inmediato. Debe concurrir al hospital para que la controlen inmediatamente. INSTRUCCIONES PARA EL CUIDADO EN EL HOGAR   Evite fumar, consumir hierbas, beber alcohol y tomar frmacos que no le hayan recetado. Estas sustancias qumicas afectan la formacin y el desarrollo del beb.  No consuma ningn producto que contenga tabaco, lo que incluye cigarrillos, tabaco de mHigher education careers advisery cPsychologist, sport and exercise Si necesita ayuda para dejar de fumar, consulte al mMeadWestvaco Puede recibir asesoramiento y otro tipo de recursos para dejar de fumar.  SHardinsburgmdico en relacin con el uso de medicamentos. Durante el embarazo, hay medicamentos que son seguros de tomar y otros que no.  Haga ejercicio solamente como se lo haya indicado el mdico. Sentir clicos uterinos es un buen signo para dAmbulance personactividad fsica.  Contine comiendo alimentos sanos con  regularidad.  Use un sostn que le brinde buen soporte si le dNordstrom  No se d baos de inmersin en agua caliente, baos turcos ni saunas.  Use el cinturn de seguridad en todo momento mientras conduce.  No coma carne cruda ni queso sin cocinar; evite el contacto con las bandejas sanitarias de los gatos y la tierra que estos animales usan. Estos elementos contienen grmenes que pueden causar defectos congnitos en el beb.  TSebastian  Tome entre 1500 y 20017mde calcio diariamente comenzando en la [id number] embarazo haConvoy Si est estreida, pruebe un laxante suave (si el mdico lo autoriza). Consuma ms alimentos ricos en fibra, como vegetales y frutas frescos y cePsychologist, prison and probation servicesBeba gran cantidad de lquido para mantener la orina de tono claro o color amarillo plido.  Dese baos de asiento con agua tibia para alBest boy las molestias causadas por las hemorroides. Use una crema para las hemorroides si el mdico la autoriza.  Si tiene venas varicosas, use medias de descanso. Eleve los pies durante 1518mtos, 3 o 4veces por da. Limite el consumo de sal en su dieta.  Evite levantar objetos pesados, use zapatos de tacones bajos y manWestern SaharaDescanse  con las piernas elevadas si tiene calambres o dolor de cintura.  Visite a su dentista si no lo ha Quarry manager. Use un cepillo de dientes blando para higienizarse los dientes y psese el hilo dental con suavidad.  Puede seguir [employer] Power, a menos que el mdico le indique lo contrario.  No haga viajes largos excepto que sea absolutamente necesario y solo con la autorizacin del [place] clases prenatales para Development worker, international aid, Psychologist, prison and probation services y hacer preguntas sobre el [place] de parto y [place].  Haga un ensayo de la partida al hospital.  Prepare el bolso que llevar al hospital.  Prepare la habitacin del beb.  Concurra a todas  las visitas prenatales segn las indicaciones de su mdico. SOLICITE ATENCIN MDICA SI:  No est segura de que est en trabajo de parto o de que ha roto la bolsa de las aguas.  Tiene mareos.  Siente clicos leves, presin en la pelvis o dolor persistente en el abdomen.  Tiene nuseas, vmitos o diarrea persistentes.  Margette Fast secrecin vaginal con mal olor.  Siente dolor al Continental Airlines. SOLICITE ATENCIN MDICA DE INMEDIATO SI:   Tiene fiebre.  Tiene una prdida de lquido por la vagina.  Tiene sangrado o pequeas prdidas vaginales.  Siente dolor intenso o clicos en el abdomen.  Sube o baja de peso rpidamente.  Tiene dificultad para respirar y siente dolor de pecho.  Sbitamente se le hinchan mucho el rostro, las [place], los tobillos, los pies o las piernas.  No ha sentido los movimientos del beb durante Leone Brand.  Siente un dolor de cabeza intenso que no se alivia con medicamentos.  Su visin se modifica.   Esta informacin no tiene Marine scientist el consejo del mdico. Asegrese de hacerle al mdico cualquier pregunta que tenga.   Document Released: 12/06/2004 Document Revised: 03/19/2014 Elsevier Interactive Patient Education 2016 [place] (Breastfeeding) Decidir Economist es una de las mejores elecciones que puede hacer por usted y su beb. El cambio hormonal durante el Media planner produce el desarrollo del tejido mamario y [place] la cantidad y el tamao de los conductos galactforos. Estas hormonas tambin permiten que las protenas, los azcares y las grasas de la sangre produzcan la Northeast Utilities materna en las glndulas productoras de [place]. Las hormonas impiden que la leche materna sea liberada antes del nacimiento del beb, adems de impulsar el flujo de leche luego del nacimiento. Una vez que ha comenzado a Economist, Freight forwarder beb, as Therapist, occupational succin o Social research officer, government, pueden estimular la liberacin de [place] de las glndulas productoras de  [place].  LOS BENEFICIOS DE AMAMANTAR Para el beb  La primera leche (calostro) ayuda a Garment/textile technologist funcionamiento del sistema digestivo del beb.  La leche tiene anticuerpos que ayudan a Chemical engineer las infecciones en el beb.  El beb tiene una menor incidencia de asma, alergias y del sndrome de muerte sbita del lactante.  Los nutrientes en la [place] materna son mejores para el beb que la [place] maternizada y estn preparados exclusivamente para cubrir las necesidades del beb.  La leche materna mejora el desarrollo cerebral del beb.  Es menos probable que el beb desarrolle otras enfermedades, como obesidad infantil, asma o diabetes mellitus de tipo 2. Para usted   La lactancia materna favorece el desarrollo de un vnculo muy especial entre la madre y el beb.  Es conveniente. La leche materna siempre est disponible a la Tree surgeon y es [place].  La lactancia materna  ayuda a Medical illustrator y a perder el peso ganado durante el [place].  Favorece la contraccin del tero al tamao que tena antes del embarazo de manera ms rpida y disminuye el sangrado (loquios) despus del parto.  La lactancia materna contribuye a reducir Catering manager de desarrollar diabetes mellitus de tipo 2, osteoporosis o cncer de mama o de ovario en el futuro. SIGNOS DE QUE EL BEB EST HAMBRIENTO Primeros signos de hambre  Aumenta su estado de [place].  Se estira.  Mueve la cabeza de un lado a otro.  Mueve la cabeza y abre la boca cuando se le toca la mejilla o la comisura de la boca (reflejo de bsqueda).  [place] vocalizaciones, tales como sonidos de succin, se relame los labios, emite arrullos, suspiros, o chirridos.  Mueve la [employer] boca.  Se chupa con ganas los dedos o las manos. Signos tardos de Hartford Financial.  Llora de manera intermitente. Signos de BJ[employer] Wholesale signos de hambre extrema requerirn que lo calme y lo consuele antes de que el  beb pueda alimentarse adecuadamente. No espere a que se manifiesten los siguientes signos de hambre extrema para comenzar a Economist:   Air cabin crew.  Llanto intenso y fuerte.  Gritos. INFORMACIN BSICA SOBRE LA LACTANCIA MATERNA Iniciacin de la lactancia materna  Encuentre un lugar cmodo para sentarse o acostarse, con un buen respaldo para el cuello y la espalda.  Coloque una almohada o una manta enrollada debajo del beb para acomodarlo a la altura de la mama (si est sentada). Las almohadas para Economist se han diseado especialmente a fin de servir de apoyo para los brazos y el beb Kellogg.  Asegrese de que el abdomen del beb est frente al suyo.   Masajee suavemente la mama. Con las yemas de los dedos, masajee la pared del pecho hacia el pezn en un movimiento circular. Esto estimula el flujo de [place]. Es posible que Oceanographer este movimiento mientras amamanta si la leche fluye lentamente.  Sostenga la mama con el pulgar por arriba del pezn y los otros 4 dedos por debajo de la mama. Asegrese de que los dedos se encuentren lejos del pezn y de la boca del beb.  Empuje suavemente los labios del beb con el pezn o con el dedo.  Cuando la boca del beb se abra lo suficiente, acrquelo rpidamente a la mama e introduzca todo el pezn y la zona oscura que lo rodea (areola), tanto como sea posible, dentro de la boca del beb.  Debe haber ms areola visible por arriba del labio superior del beb que por debajo del labio inferior.  La lengua del beb debe estar entre la enca inferior y la [place].  Asegrese de que la boca del beb est en la posicin correcta alrededor del pezn (prendida). Los labios del beb deben crear un sello sobre la mama y estar doblados hacia afuera (invertidos).  Es comn que el beb succione durante 2 a 3 minutos para que comience el flujo de [place]. Cmo debe prenderse Es muy importante que le ensee al beb cmo prenderse  adecuadamente a la mama. Si el beb no se prende adecuadamente, puede causarle dolor en el pezn y reducir la produccin de [place], y hacer que el beb tenga un escaso aumento de [place]. Adems, si el beb no se prende adecuadamente al pezn, puede tragar aire durante la alimentacin. Esto puede causarle molestias al beb. Hacer eructar al beb al Eliezer Lofts  de mama puede ayudarlo a Futures trader. Sin embargo, ensearle al beb cmo prenderse a la mama adecuadamente es la mejor manera de evitar que se sienta molesto por tragar Administrator, sports se alimenta. Signos de que el beb se ha prendido adecuadamente al pezn:   Hall Busing o succiona de modo silencioso, sin causarle dolor.  Se escucha que traga cada 3 o 4 succiones.  Hay movimientos musculares por arriba y por delante de sus odos al Mining engineer. Signos de que el beb no se ha prendido Product manager al pezn:   Hace ruidos de succin o de chasquido mientras se alimenta.  Siente dolor en el pezn. Si cree que el beb no se prendi correctamente, deslice el dedo en la comisura de la boca y Micron Technology las encas del beb para interrumpir la succin. Intente comenzar a amamantar nuevamente. Signos de Economist Signos del beb:   Disminuye gradualmente el nmero de succiones o cesa la succin por completo.  Se duerme.  Relaja el cuerpo.  Retiene una pequea cantidad de ALLTEL Corporation boca.  Se desprende solo del pecho. Signos que presenta usted:  Las mamas han aumentado la firmeza, el peso y el tamao 1 a 3 horas despus de Economist.  Estn ms blandas inmediatamente despus de amamantar.  Un aumento del volumen de [place], y tambin un cambio en su consistencia y color se producen hacia el [place] de Therapist, nutritional.  Los pezones no duelen, ni estn agrietados ni sangran. Signos de que su beb recibe la cantidad de leche suficiente  Moja al menos 3 paales en 24 horas. La orina debe ser clara y de color  amarillo plido a los [place].  Defeca al menos 3 veces en 24 horas a los 5 das de vida. La materia fecal debe ser blanda y [place].  Defeca al menos 3 veces en 24 horas a los 7 das de vida. La materia fecal debe ser grumosa y [place].  No registra una prdida de peso mayor del 10% del peso al nacer durante los primeros [place].  Aumenta de peso un promedio de 4 a 7onzas (113 a 198g) por semana despus de los [place].  Aumenta de [place], [place], de [place] uniforme a Proofreader de los 5 das de vida, sin Museum/gallery curator prdida de peso despus de las 2semanas de vida. Despus de alimentarse, es posible que el beb regurgite una pequea cantidad. Esto es frecuente. FRECUENCIA Y DURACIN DE LA LACTANCIA MATERNA El amamantamiento frecuente la ayudar a producir ms [place] y a Warden/ranger de Social research officer, government en los pezones e hinchazn en las [place]. Alimente al beb cuando muestre signos de hambre o si siente la necesidad de reducir la congestin de las [place]. Esto se denomina "lactancia a demanda". Evite el uso del chupete mientras trabaja para establecer la lactancia (las primeras 4 a 6 semanas despus del nacimiento del beb). Despus de este perodo, podr ofrecerle un chupete. Las investigaciones demostraron que el uso del chupete durante el primer ao de vida del beb disminuye el riesgo de desarrollar el sndrome de muerte sbita del lactante (SMSL). Permita que el nio se alimente en cada mama todo lo que desee. Contine amamantando al beb hasta que haya terminado de alimentarse. Cuando el beb se desprende o se queda dormido mientras se est alimentando de la primera mama, ofrzcale la segunda. Debido a que, con frecuencia, los recin [place] las primeras semanas de vida, es posible que  deba despertar al beb para alimentarlo. Los horarios de Writer de un beb a otro. Sin embargo, las siguientes reglas pueden servir como gua para ayudarla a  Engineer, materials que el beb se alimenta adecuadamente:  Se puede amamantar a los recin nacidos (bebs de 4 semanas o menos de vida) cada 1 a 3 horas.  No deben transcurrir ms de 3 horas durante el da o 5 horas durante la noche sin que se amamante a los recin nacidos.  Debe amamantar al beb 8 veces como mnimo en un perodo de 24 horas, hasta que comience a introducir slidos en su dieta, a los 6 meses de vida aproximadamente. [place] extraccin y Recruitment consultant de la leche materna le permiten asegurarse de que el beb se alimente exclusivamente de [place], aun en momentos en los que no puede amamantar. Esto tiene especial importancia si debe regresar al [place] en el perodo en que an est amamantando o si no puede estar presente en los momentos en que el beb debe alimentarse. Su asesor en lactancia puede orientarla sobre cunto tiempo es [place].  El sacaleche es un aparato que le permite extraer leche de la mama a un recipiente estril. Luego, la leche materna extrada puede almacenarse en un refrigerador o Pension scheme manager. Algunos sacaleches son Theodore Demark, James Ivanoff otros son elctricos. Consulte a su asesor en lactancia qu tipo ser ms conveniente para usted. Los sacaleches se pueden comprar; sin embargo, algunos hospitales y grupos de apoyo a la lactancia materna alquilan Production assistant, radio. Un asesor en lactancia puede ensearle cmo extraer OfficeMax Incorporated, en caso de que prefiera no usar un sacaleche.  CMO CUIDAR LAS MAMAS DURANTE LA LACTANCIA MATERNA Los pezones se secan, agrietan y duelen durante la Therapist, nutritional. Las siguientes recomendaciones pueden ayudarla a Theatre manager las [employer] y sanas:  Art therapist usar jabn en los pezones.  Use un sostn de soporte. Aunque no son esenciales, las camisetas sin mangas o los sostenes especiales para Economist estn diseados para acceder fcilmente a las mamas, para Economist  sin tener que quitarse todo el sostn o la camiseta. Evite usar sostenes con aro o sostenes muy ajustados.  Seque al aire sus pezones durante 3 a 33mnutos despus de amamantar al beb.  Utilice solo apsitos de aChiropodistsostn para aTax adviserlas prdidas de lDaviston La prdida de un poco de lOwens Corningtomas es normal.  Utilice lanolina sobre los pezones luego de aEconomist La lanolina ayuda a mantener la humedad normal de la piel. Si uCanadalanolina pura, no tiene que lavarse los pezones antes de volver a aResearch scientist (life sciences)al beb. La lanolina pura no es txica para el beb. Adems, puede extraer mCiscoalgunas gotas de lOakleymaterna y mCommunity education officersuavemente esa lFranklin Resources para que la lCentrevillese seque al aire. Durante las primeras semanas despus de dar a luz, algunas mujeres pueden experimentar hinchazn en las mamas (congestin mFour Corn[place]. La congestin puede hacer que sienta las mamas pesadas, calientes y sensibles al tacto. El pico de la congestin ocurre dentro de los 3 a 5 das despus del pMontgomery Las siguientes recomendaciones pueden ayudarla a aPublic house managerla congestin:  Vace por completo las mamas al aGaines[place] Puede aplicar calor hmedo en las mamas (en la ducha o con toallas hmedas para manos) antes de aEconomisto extraer lNortheast Utilities Esto aumenta la circulacin y aSaint Helenaa que la lCantril Si el beb no vaca por completo las mLincoln National Corporation  cuando lo amamanta, extraiga la Northeast Utilities restante despus de que haya finalizado.  Use un sostn ajustado (para amamantar o comn) o una camiseta sin mangas durante 1 o 2 das para indicar al cuerpo que disminuya ligeramente la produccin de [place].  Aplique compresas de hielo E[employer], a menos que le resulte demasiado incmodo.  Asegrese de que el beb est prendido y se encuentre en la posicin correcta mientras lo alimenta. Si la congestin persiste luego de 48 horas o despus de seguir estas recomendaciones, comunquese con su  mdico o un Lobbyist. RECOMENDACIONES GENERALES PARA EL CUIDADO DE LA SALUD DURANTE LA LACTANCIA MATERNA  Consuma alimentos saludables. [place]ne comidas y colaciones, y coma 3 de cada una por da. Dado que lo que come Solectron Corporation, es posible que algunas comidas hagan que su beb se vuelva ms irritable de lo habitual. Evite comer este tipo de alimentos si percibe que afectan de manera negativa al beb.  Beba leche, jugos de fruta y agua para Engineer, water su sed (aproximadamente [place]).  Descanse con frecuencia, reljese y tome sus vitaminas prenatales para evitar la fatiga, el estrs y la anemia.  Contine con los autocontroles de la mama.  Evite Engineer, manufacturing systems y fumar tabaco. Las sustancias qumicas de los cigarrillos que pasan a la leche materna y la exposicin al humo ambiental del tabaco pueden daar al beb.  No consuma alcohol ni drogas, incluida la marihuana. Algunos medicamentos, que pueden ser perjudiciales para el beb, pueden pasar a travs de la SLM Corporation. Es importante que consulte a su mdico antes de Medical sales representative, incluidos todos los medicamentos recetados y de [place], as como los suplementos vitamnicos y herbales. Puede quedar embarazada durante la lactancia. Si desea controlar la natalidad, consulte a su mdico cules son las opciones ms seguras para el beb. SOLICITE ATENCIN MDICA SI:   Usted siente que quiere dejar de Economist o se siente frustrada con la lactancia.  Siente dolor en las mamas o en los pezones.  Sus pezones estn agrietados o Control and instrumentation engineer.  Sus pechos estn irritados, sensibles o calientes.  Tiene un rea hinchada en cualquiera de las mamas.  Siente escalofros o fiebre.  Tiene nuseas o vmitos.  Presenta una secrecin de otro lquido distinto de la leche materna de los pezones.  Sus mamas no se llenan antes de Economist al beb para el quinto da despus del [place].  Se siente triste y  deprimida.  El beb est demasiado somnoliento como para comer bien.  El beb tiene problemas para dormir.  Moja menos de 3 paales en 24 horas.  Defeca menos de 3 veces en 24 horas.  La piel del beb o la parte blanca de los ojos se vuelven amarillentas.  El beb no ha aumentado de [place] a los [place]. SOLICITE ATENCIN MDICA DE INMEDIATO SI:   El beb est muy cansado Engineer, manufacturing) y no se quiere despertar para comer.  Le sube la fiebre sin causa.   Esta informacin no tiene Marine scientist el consejo del mdico. Asegrese de hacerle al mdico cualquier pregunta que tenga.   Document Released: 02/26/2005 Document Revised: 11/17/2014 Elsevier Interactive Patient Education 2016 [place] en Waterbirth ???  Debes asistir a Furniture conservator/restorer clase Waterbirth en [facility] - [facility] de la Mujer 3er mircoles de cada mes de 7-9pm Gratis Regstrese llamando al [phone] o en lnea en [email or web address] Trigan[place] el certificado de la clase   Suministros de agua de  mar necesarios para los pacientes de la [place] / [place] / [place] / [place] de [place]: [place] prctica tiene una piscina de nacimiento en una baera de caja en el hospital que usted puede pedir prestado Usted tendr que comprar un kit de accesorios que tiene todos los suministros necesarios a Designer, jewellery [place] [phone]) o en lnea $ 175.00 O puede comprar los suministros por separado: Revestimiento de una sola pieza para baeras desechables para piscina de nacimiento en una caja (tamao REGULAR) Nueva manguera de jardn etiquetada como "sin plomo", "apta para agua potable", Bomba de drenaje elctrica para eliminar el agua (recomendamos 792 galones por hora o una bomba mayor.) "No txico" O "agua potable" Manguera de jardn para quitar el agua sucia red de pesca Traje de bao (opcional) Espejo de mango largo (opcional) [email or web address].is vende tinas por ~ $ 120 si  prefiere comprar su propia tina Las seoras del [place] ([email or web address]) $ 275 para alquiler de baera / set-up & take down / kit   Cosas que le impiden tener un waterbirth: Solicitor, <37wks Cesrea anterior Presencia de lquido manchado de meconio grueso Armed forces logistics/support/administrative officer mltiple (gemelos, trillizos, Social research officer, government.) Diabetes no controlada Hipertensin Sangrado vaginal intenso Frecuencia cardaca fetal no tranquilizadora La infeccin activa (MRSA, etc.) Si su trabajo de parto tiene que ser inducido y el mtodo de induccin requiere un monitoreo continuo de la frecuencia cardaca del beb Otros riesgos / problemas identificados por su proveedor de obstetricia

## 2015-02-01 LAB — CULTURE, OB URINE
COLONY COUNT: NO GROWTH
Organism ID, Bacteria: NO GROWTH

## 2015-02-02 LAB — CULTURE, BETA STREP (GROUP B ONLY)

## 2015-02-07 ENCOUNTER — Ambulatory Visit (INDEPENDENT_AMBULATORY_CARE_PROVIDER_SITE_OTHER): Payer: Medicaid Other | Admitting: Obstetrics and Gynecology

## 2015-02-07 ENCOUNTER — Encounter: Payer: Self-pay | Admitting: Obstetrics and Gynecology

## 2015-02-07 VITALS — BP 120/68 | HR 88 | Temp 98.0°F | Wt 148.8 lb

## 2015-02-07 DIAGNOSIS — O0993 Supervision of high risk pregnancy, unspecified, third trimester: Secondary | ICD-10-CM

## 2015-02-07 DIAGNOSIS — O24410 Gestational diabetes mellitus in pregnancy, diet controlled: Secondary | ICD-10-CM

## 2015-02-07 LAB — POCT URINALYSIS DIP (DEVICE)
Bilirubin Urine: NEGATIVE
Glucose, UA: 100 mg/dL — AB
Hgb urine dipstick: NEGATIVE
Ketones, ur: NEGATIVE mg/dL
NITRITE: NEGATIVE
PH: 6.5 (ref 5.0–8.0)
PROTEIN: NEGATIVE mg/dL
Specific Gravity, Urine: 1.025 (ref 1.005–1.030)
UROBILINOGEN UA: 0.2 mg/dL (ref 0.0–1.0)

## 2015-02-07 NOTE — Progress Notes (Signed)
Subjective:  Ronn MelenaGrecia Rochelle Cruz Luis is a 29 y.o. G1P0 at 5835w5d being seen today for ongoing prenatal care.  She is currently monitored for the following issues for this high-risk pregnancy and has Supervision of high-risk pregnancy; GERD (gastroesophageal reflux disease); Gestational diabetes; and Low-lying placenta on her problem list.  Patient reports no complaints.  Contractions: Irritability. Vag. Bleeding: None.  Movement: Present. Denies leaking of fluid.   The following portions of the patient's history were reviewed and updated as appropriate: allergies, current medications, past family history, past medical history, past social history, past surgical history and problem list. Problem list updated.  Objective:   Filed Vitals:   02/07/15 0846  BP: 120/68  Pulse: 88  Temp: 98 F (36.7 C)  Weight: 148 lb 12.8 oz (67.495 kg)    Fetal Status: Fetal Heart Rate (bpm): 153   Movement: Present     General:  Alert, oriented and cooperative. Patient is in no acute distress.  Skin: Skin is warm and dry. No rash noted.   Cardiovascular: Normal heart rate noted  Respiratory: Normal respiratory effort, no problems with respiration noted  Abdomen: Soft, gravid, appropriate for gestational age. Pain/Pressure: Present     Pelvic: Vag. Bleeding: None     Cervical exam deferred        Extremities: Normal range of motion.  Edema: None  Mental Status: Normal mood and affect. Normal behavior. Normal judgment and thought content.   Urinalysis: Urine Protein: 1+ Urine Glucose: Negative  Assessment and Plan:  Pregnancy: G1P0 at 3235w5d  1. Diet controlled gestational diabetes mellitus in third trimester Patient did not bring log book but reports fasting ranging from 75-92 and 2 hr pp 121-125 one high value of 142  2. Supervision of high-risk pregnancy, third trimester Ultrasound from Dr. Gaynell FaceMarshall reviewed. 2 ultrasounds with EDD 03/16/2015 (the first being at 17 weeks)  Preterm labor symptoms  and general obstetric precautions including but not limited to vaginal bleeding, contractions, leaking of fluid and fetal movement were reviewed in detail with the patient. Please refer to After Visit Summary for other counseling recommendations.  Return in about 2 weeks (around 02/21/2015).   Catalina AntiguaPeggy Jayde Mcallister, MD

## 2015-02-07 NOTE — Progress Notes (Signed)
Spanish interpreter RipleyBlanca  Pain- groin, ligament

## 2015-02-08 ENCOUNTER — Ambulatory Visit (HOSPITAL_COMMUNITY)
Admission: RE | Admit: 2015-02-08 | Discharge: 2015-02-08 | Disposition: A | Discharge status: Home / Self Care | Payer: Self-pay | Source: Ambulatory Visit | Attending: Family Medicine | Admitting: Family Medicine

## 2015-02-08 ENCOUNTER — Encounter (HOSPITAL_COMMUNITY): Payer: Self-pay

## 2015-02-08 ENCOUNTER — Other Ambulatory Visit: Payer: Self-pay | Admitting: Family Medicine

## 2015-02-08 DIAGNOSIS — O0993 Supervision of high risk pregnancy, unspecified, third trimester: Secondary | ICD-10-CM

## 2015-02-08 DIAGNOSIS — Z1389 Encounter for screening for other disorder: Secondary | ICD-10-CM

## 2015-02-08 DIAGNOSIS — Z3A34 34 weeks gestation of pregnancy: Secondary | ICD-10-CM | POA: Insufficient documentation

## 2015-02-08 DIAGNOSIS — O24419 Gestational diabetes mellitus in pregnancy, unspecified control: Secondary | ICD-10-CM | POA: Insufficient documentation

## 2015-02-08 DIAGNOSIS — Z36 Encounter for antenatal screening of mother: Secondary | ICD-10-CM | POA: Insufficient documentation

## 2015-02-14 ENCOUNTER — Other Ambulatory Visit: Payer: Self-pay | Admitting: Family Medicine

## 2015-02-14 DIAGNOSIS — Z3A34 34 weeks gestation of pregnancy: Secondary | ICD-10-CM

## 2015-02-14 DIAGNOSIS — O24419 Gestational diabetes mellitus in pregnancy, unspecified control: Secondary | ICD-10-CM

## 2015-02-14 DIAGNOSIS — Z1389 Encounter for screening for other disorder: Secondary | ICD-10-CM

## 2015-02-21 ENCOUNTER — Ambulatory Visit (INDEPENDENT_AMBULATORY_CARE_PROVIDER_SITE_OTHER): Payer: Medicaid Other | Admitting: Family Medicine

## 2015-02-21 VITALS — BP 116/66 | HR 79 | Temp 98.6°F | Wt 149.8 lb

## 2015-02-21 DIAGNOSIS — O0993 Supervision of high risk pregnancy, unspecified, third trimester: Secondary | ICD-10-CM

## 2015-02-21 DIAGNOSIS — K219 Gastro-esophageal reflux disease without esophagitis: Secondary | ICD-10-CM

## 2015-02-21 DIAGNOSIS — O99613 Diseases of the digestive system complicating pregnancy, third trimester: Secondary | ICD-10-CM

## 2015-02-21 DIAGNOSIS — O24410 Gestational diabetes mellitus in pregnancy, diet controlled: Secondary | ICD-10-CM

## 2015-02-21 DIAGNOSIS — O4440 Low lying placenta NOS or without hemorrhage, unspecified trimester: Secondary | ICD-10-CM

## 2015-02-21 DIAGNOSIS — Z113 Encounter for screening for infections with a predominantly sexual mode of transmission: Secondary | ICD-10-CM

## 2015-02-21 LAB — OB RESULTS CONSOLE GBS: GBS: POSITIVE

## 2015-02-21 LAB — POCT URINALYSIS DIP (DEVICE)
BILIRUBIN URINE: NEGATIVE
GLUCOSE, UA: NEGATIVE mg/dL
Hgb urine dipstick: NEGATIVE
Ketones, ur: NEGATIVE mg/dL
NITRITE: NEGATIVE
PH: 7.5 (ref 5.0–8.0)
PROTEIN: NEGATIVE mg/dL
Specific Gravity, Urine: 1.02 (ref 1.005–1.030)
Urobilinogen, UA: 0.2 mg/dL (ref 0.0–1.0)

## 2015-02-21 NOTE — Progress Notes (Signed)
Subjective:  Elizabeth [name] is a 29 y.o. G1P0 at 20105w5d being seen today for ongoing prenatal care.  She is currently monitored for the following issues for this high-risk pregnancy and has Supervision of high-risk pregnancy; GERD (gastroesophageal reflux disease); Gestational diabetes; and Low-lying placenta on her problem list.  Patient reports no complaints.  Contractions: Irritability. Vag. Bleeding: None.  Movement: Present. Denies leaking of fluid.   The following portions of the patient's history were reviewed and updated as appropriate: allergies, current medications, past family history, past medical history, past social history, past surgical history and problem list. Problem list updated.  Objective:   Filed Vitals:   02/21/15 1018  BP: 116/66  Pulse: 79  Temp: 98.6 F (37 C)  Weight: 149 lb 12.8 oz (67.949 kg)    Fetal Status: Fetal Heart Rate (bpm): 140 Fundal Height: 36 cm Movement: Present     General:  Alert, oriented and cooperative. Patient is in no acute distress.  Skin: Skin is warm and dry. No rash noted.   Cardiovascular: Normal heart rate noted  Respiratory: Normal respiratory effort, no problems with respiration noted  Abdomen: Soft, gravid, appropriate for gestational age. Pain/Pressure: Present     Pelvic: Vag. Bleeding: None     Cervical exam deferred        Extremities: Normal range of motion.  Edema: None  Mental Status: Normal mood and affect. Normal behavior. Normal judgment and thought content.   Urinalysis: Urine Protein: Negative Urine Glucose: Negative  Fasting - < 93 2hr- < 120, highest 133. Typically 115- 124.    Assessment and Plan:  Pregnancy: G1P0 at 4105w5d  1. Gastroesophageal reflux disease, esophagitis presence not specified - controlled  2. Diet controlled gestational diabetes mellitus in third trimester - controlled blood sugars  3. Low-lying placenta -resolved  4. Supervision of high-risk pregnancy, third  trimester -updated box -collected GBS and GC/CT  Term labor symptoms and general obstetric precautions including but not limited to vaginal bleeding, contractions, leaking of fluid and fetal movement were reviewed in detail with the patient. Please refer to After Visit Summary for other counseling recommendations.   Return in about 1 week (around 02/28/2015) for Routine prenatal care.   Federico FlakeKimberly Niles Newton, MD

## 2015-02-21 NOTE — Patient Instructions (Signed)
Alivio del dolor durante el trabajo de parto y Engineer, manufacturing systemsel parto (Pain Relief During Labor and Delivery) Todas las personas experimentan el dolor de [place]manera diferente, pero el trabajo de parto causa dolor intenso en muchas mujeres. El nivel de dolor que usted experimenta en el trabajo del parto depende de su tolerancia al dolor, de la intensidad de las contracciones y del tamao y posicin del beb. Hay muchas formas de prepararse y Software engineerenfrentar el dolor, por ejemplo:   Tomar clases de preparto para aprender acerca del Aleen Campitrabajo de parto y Bradyel parto. Cuanto ms informada est, menos ansiedad y temor tendr. Vivia BudgeEsto puede ayudar a Teacher, early years/predisminuir el dolor.  Tomar medicamentos para Engineer, materialsaliviar el dolor durante el Willistontrabajo de parto y Randolphel parto.  Aprender y Chemical engineerutilizar tcnicas de relajacin.  Tomar una ducha o un bao de inmersin.  Recibir masajes.  Cambiar de posicin.  Colocarse una bolsa de hielo en la espalda. Comente las opciones para el control del dolor con su mdico durante las visitas prenatales.  CULES SON LAS DOS OPCIONES DE MEDICAMENTOS PARA ALIVIAR EL DOLOR? 1. Analgsicos. Estos son medicamentos que Psychologist, occupationaldisminuyen el dolor sin que se pierdan totalmente la sensibilidad o los movimientos musculares. 2. Anestsicos. Estos son medicamentos que bloquean toda la sensibilidad, incluso Chief Technology Officerel dolor. Ambos tipos tienen efectos secundarios mnimos, como nuseas, dificultad para concentrarse, estar somnolienta y disminuir la frecuencia cardaca del beb. Sin embargo, el mdico tendr la precaucin de Building services engineeradministrar dosis que no afecten demasiado al beb.  CULES SON LOS TIPOS ESPECFICOS DE ANALGSICOS Y ANESTSICOS? Analgsico sistmico Los medicamentos para Chief Technology Officerel dolor sistmicos no se Psychologist, educationalconcentran el aliviar el dolor en la zona en la que lo siente sino que afectan todo su organismo. Este tipo de medicamento se administra a travs de una va IV en la vena o de modo inyectable (inyeccin) en el msculo. Este medicamento Pharmacologistaliviar el  dolor pero no lo calmar totalmente. Tambin la har sentir somnolienta, pero no har que pierda la conciencia.  Anestsico local El anestsico local seutiliza paraadormecer una pequea zona del cuerpo. El medicamento se inyecta en la zona de los nervios que transmiten la sensibilidad a la vagina, la vulva o la zona que se encuentra entre la vagina y el ano (perineo). Anestesia general Este tipo de medicamento provoca la prdida de la conciencia de modo que no Financial risk analystsentir dolor. Generalmente se Bots[place]ausa solo en caso de situaciones de emergencia durante el trabajo de Oakfieldparto. Se administra a travs de una va IV o una mscara facial. Bloqueo paracervical Un bloqueo paracervical es una forma de anestesia local que se administra durante el Sheffieldtrabajo de [place] Springsparto. Se inyecta el anestsico en las zonas derecha e izquierda del cuello del tero y la vagina. Esto ayuda a Teacher, early years/predisminuir el dolor causado por las contracciones y el estiramiento del cuello del tero. Puede administrase ms de una vez.  Bloqueo pudendo El bloqueo pudendo es otra forma de anestesia local. Se utiliza para Acupuncturistaliviar el dolor asociado con la presin o la distensin del perineo en el momento del Delmarparto. Se aplica una inyeccin de manera profunda en la pared vaginal hacia el nervio pudendo en la pelvis, adormeciendo el perineo  Anestesia epidural La epidural es una inyeccin de medicamento anestsico que se aplica en la parte inferior de la espalda y en el espacio epidural, cerca de la mdula espinal. La epidural adormece la mitad inferior del cuerpo. Podr mover las piernas, pero no Warehouse managerle permitirn caminar. La epidural se Bots[place]ausa en el trabajo de [place] Carroll[place]parto, el [place]parto  o la cesrea.  Para evitar que se termine el efecto del medicamento, le colocarn un tubo pequeo (catter) en el espacio epidural y lo pegarn con cinta en el lugar para evitar que se salga. Luego le administran el medicamento de Edenmanera continua, en pequeas dosis, a travs del tubo, hasta que finalice el  [place]ock[place]parto. Bloqueo espinal El bloqueo espinal es similar a la epidural, pero los medicamentos se inyectan en el lquido espinal y no en el espacio epidural. El bloqueo espinal solo se aplica una vez. Comienza a Dispensing opticianaliviar el dolor rpidamente, pero solo dura 1 o 2horas. El bloqueo espinal tambin se Coc[place] ([place]) Islandsutiliza en las cesreas.  Bloqueo espinal-epidural combinado El bloqueo espinal-epidural combinado rene los beneficios de ambos mtodos. La parte espinal acta rpidamente para Engineer, materialsaliviar el dolor y la epidural proporciona alivio continuo del Engineer, miningdolor. Hidroterapia La inmersin en agua caliente durante el trabajo de parto puede proporcionar comodidad y relax. Tambin ayuda a Standard Pacificdisminuir el dolor, el uso de anestesia y la duracin del Warrentrabajo de Cartwrightparto. Sin embargo, la inmersin en agua durante el parto (parto en el agua) puede Advertising copywritertener algn riesgo, y se estn realizando estudios para Chief Strategy Officerdeterminar su seguridad y sus riesgos. Si es AmerisourceBergen Corporationuna mujer sana que espera un parto sin complicaciones, hable con su mdico para ver si el parto en el agua es una opcin para usted.    Esta informacin no tiene Theme park managercomo fin reemplazar el consejo del mdico. Asegrese de hacerle al mdico cualquier pregunta que tenga.   Document Released: 12/24/2008 Document Revised: 03/03/2013 Elsevier Interactive Patient Education [employer]2016 Elsevier Inc.

## 2015-02-21 NOTE — Progress Notes (Signed)
Breastfeeding tip of the week reviewed Cultures today 

## 2015-02-22 LAB — GC/CHLAMYDIA PROBE AMP (~~LOC~~) NOT AT ARMC
Chlamydia: NEGATIVE
NEISSERIA GONORRHEA: NEGATIVE

## 2015-02-24 LAB — CULTURE, BETA STREP (GROUP B ONLY)

## 2015-02-25 ENCOUNTER — Inpatient Hospital Stay (HOSPITAL_COMMUNITY)
Admission: AD | Admit: 2015-02-25 | Discharge: 2015-02-27 | DRG: 775 | Disposition: A | Discharge status: Home / Self Care | Payer: Medicaid Other | Source: Ambulatory Visit | Attending: Obstetrics & Gynecology | Admitting: Obstetrics & Gynecology

## 2015-02-25 ENCOUNTER — Encounter (HOSPITAL_COMMUNITY): Payer: Self-pay

## 2015-02-25 DIAGNOSIS — Z833 Family history of diabetes mellitus: Secondary | ICD-10-CM

## 2015-02-25 DIAGNOSIS — O24420 Gestational diabetes mellitus in childbirth, diet controlled: Secondary | ICD-10-CM | POA: Diagnosis present

## 2015-02-25 DIAGNOSIS — O99820 Streptococcus B carrier state complicating pregnancy: Secondary | ICD-10-CM

## 2015-02-25 DIAGNOSIS — O24419 Gestational diabetes mellitus in pregnancy, unspecified control: Secondary | ICD-10-CM | POA: Diagnosis present

## 2015-02-25 DIAGNOSIS — O4440 Low lying placenta NOS or without hemorrhage, unspecified trimester: Secondary | ICD-10-CM

## 2015-02-25 DIAGNOSIS — IMO0001 Reserved for inherently not codable concepts without codable children: Secondary | ICD-10-CM

## 2015-02-25 DIAGNOSIS — O0993 Supervision of high risk pregnancy, unspecified, third trimester: Secondary | ICD-10-CM

## 2015-02-25 DIAGNOSIS — Z3A37 37 weeks gestation of pregnancy: Secondary | ICD-10-CM | POA: Diagnosis not present

## 2015-02-25 DIAGNOSIS — Z8249 Family history of ischemic heart disease and other diseases of the circulatory system: Secondary | ICD-10-CM | POA: Diagnosis not present

## 2015-02-25 LAB — GLUCOSE, CAPILLARY
GLUCOSE-CAPILLARY: 87 mg/dL (ref 65–99)
Glucose-Capillary: 125 mg/dL — ABNORMAL HIGH (ref 65–99)

## 2015-02-25 LAB — CBC
HEMATOCRIT: 41.1 % (ref 36.0–46.0)
HEMOGLOBIN: 14.4 g/dL (ref 12.0–15.0)
MCH: 31.2 pg (ref 26.0–34.0)
MCHC: 35 g/dL (ref 30.0–36.0)
MCV: 89 fL (ref 78.0–100.0)
Platelets: 212 10*3/uL (ref 150–400)
RBC: 4.62 MIL/uL (ref 3.87–5.11)
RDW: 13.4 % (ref 11.5–15.5)
WBC: 8.1 10*3/uL (ref 4.0–10.5)

## 2015-02-25 LAB — TYPE AND SCREEN
ABO/RH(D): O POS
ANTIBODY SCREEN: NEGATIVE

## 2015-02-25 LAB — ABO/RH: ABO/RH(D): O POS

## 2015-02-25 MED ORDER — DIBUCAINE 1 % RE OINT: 1.0000 "application " | TOPICAL_OINTMENT | RECTAL | Status: DC | PRN

## 2015-02-25 MED ORDER — WITCH HAZEL-GLYCERIN EX PADS: 1.0000 "application " | MEDICATED_PAD | CUTANEOUS | Status: DC | PRN

## 2015-02-25 MED ORDER — DIPHENHYDRAMINE HCL 25 MG PO CAPS: 25.0000 mg | ORAL_CAPSULE | Freq: Four times a day (QID) | ORAL | Status: DC | PRN

## 2015-02-25 MED ORDER — FLEET ENEMA 7-19 GM/118ML RE ENEM: 1.0000 | ENEMA | RECTAL | Status: DC | PRN

## 2015-02-25 MED ORDER — TETANUS-DIPHTH-ACELL PERTUSSIS 5-2.5-18.5 LF-MCG/0.5 IM SUSP: 0.5000 mL | Freq: Once | INTRAMUSCULAR | Status: DC

## 2015-02-25 MED ORDER — CITRIC ACID-SODIUM CITRATE 334-500 MG/5ML PO SOLN: 30.0000 mL | ORAL | Status: DC | PRN

## 2015-02-25 MED ORDER — OXYCODONE-ACETAMINOPHEN 5-325 MG PO TABS: 1.0000 | ORAL_TABLET | ORAL | Status: DC | PRN

## 2015-02-25 MED ORDER — PENICILLIN G POTASSIUM 5000000 UNITS IJ SOLR
2.5000 10*6.[IU] | INTRAVENOUS | Status: DC
  Filled 2015-02-25 (×4): qty 2.5

## 2015-02-25 MED ORDER — OXYTOCIN BOLUS FROM INFUSION: 500.0000 mL | INTRAVENOUS | Status: DC

## 2015-02-25 MED ORDER — ONDANSETRON HCL 4 MG/2ML IJ SOLN: 4.0000 mg | INTRAMUSCULAR | Status: DC | PRN

## 2015-02-25 MED ORDER — PRENATAL MULTIVITAMIN CH
1.0000 | ORAL_TABLET | Freq: Every day | ORAL | Status: DC
  Administered 2015-02-26 – 2015-02-27 (×2): 1 via ORAL
  Filled 2015-02-25 (×2): qty 1

## 2015-02-25 MED ORDER — ACETAMINOPHEN 325 MG PO TABS: 650.0000 mg | ORAL_TABLET | ORAL | Status: DC | PRN

## 2015-02-25 MED ORDER — ZOLPIDEM TARTRATE 5 MG PO TABS
5.0000 mg | ORAL_TABLET | Freq: Every evening | ORAL | Status: DC | PRN
Start: 2015-02-25 — End: 2015-02-27

## 2015-02-25 MED ORDER — BENZOCAINE-MENTHOL 20-0.5 % EX AERO
1.0000 "application " | INHALATION_SPRAY | CUTANEOUS | Status: DC | PRN
  Filled 2015-02-25: qty 56

## 2015-02-25 MED ORDER — SIMETHICONE 80 MG PO CHEW: 80.0000 mg | CHEWABLE_TABLET | ORAL | Status: DC | PRN

## 2015-02-25 MED ORDER — OXYCODONE-ACETAMINOPHEN 5-325 MG PO TABS: 2.0000 | ORAL_TABLET | ORAL | Status: DC | PRN

## 2015-02-25 MED ORDER — OXYTOCIN 40 UNITS IN LACTATED RINGERS INFUSION - SIMPLE MED
62.5000 mL/h | INTRAVENOUS | Status: DC
  Administered 2015-02-25: 62.5 mL/h via INTRAVENOUS
  Filled 2015-02-25: qty 1000

## 2015-02-25 MED ORDER — ONDANSETRON HCL 4 MG/2ML IJ SOLN: 4.0000 mg | Freq: Four times a day (QID) | INTRAMUSCULAR | Status: DC | PRN

## 2015-02-25 MED ORDER — PENICILLIN G POTASSIUM 5000000 UNITS IJ SOLR
5.0000 10*6.[IU] | Freq: Once | INTRAVENOUS | Status: AC
  Administered 2015-02-25: 5 10*6.[IU] via INTRAVENOUS
  Filled 2015-02-25: qty 5

## 2015-02-25 MED ORDER — SENNOSIDES-DOCUSATE SODIUM 8.6-50 MG PO TABS
2.0000 | ORAL_TABLET | ORAL | Status: DC
  Administered 2015-02-25 – 2015-02-26 (×2): 2 via ORAL
  Filled 2015-02-25 (×2): qty 2

## 2015-02-25 MED ORDER — IBUPROFEN 600 MG PO TABS
600.0000 mg | ORAL_TABLET | Freq: Four times a day (QID) | ORAL | Status: DC
  Administered 2015-02-25 – 2015-02-27 (×8): 600 mg via ORAL
  Filled 2015-02-25 (×7): qty 1

## 2015-02-25 MED ORDER — LANOLIN HYDROUS EX OINT
TOPICAL_OINTMENT | CUTANEOUS | Status: DC | PRN
Start: 2015-02-25 — End: 2015-02-27

## 2015-02-25 MED ORDER — LACTATED RINGERS IV SOLN: 500.0000 mL | INTRAVENOUS | Status: DC | PRN

## 2015-02-25 MED ORDER — LIDOCAINE HCL (PF) 1 % IJ SOLN
30.0000 mL | INTRAMUSCULAR | Status: DC | PRN
  Administered 2015-02-25: 30 mL via SUBCUTANEOUS
  Filled 2015-02-25: qty 30

## 2015-02-25 MED ORDER — ONDANSETRON HCL 4 MG PO TABS: 4.0000 mg | ORAL_TABLET | ORAL | Status: DC | PRN

## 2015-02-25 MED ORDER — LACTATED RINGERS IV SOLN
INTRAVENOUS | Status: DC
  Administered 2015-02-25: 13:00:00 via INTRAVENOUS

## 2015-02-25 NOTE — Progress Notes (Signed)
Delivery of live viable female by M. Mayford KnifeWilliams, CNM APGARS [phone]

## 2015-02-25 NOTE — H&P (Signed)
Elizabeth [name] is a 29 y.o. female presenting for Active Labor  Maternal Medical History:  Reason for admission: Contractions.  Nausea.  Contractions: Onset was 3-5 hours ago.   Frequency: regular.   Perceived severity is strong.    Fetal activity: Perceived fetal activity is normal.   Last perceived fetal movement was within the past hour.    Prenatal complications: No bleeding, PIH, oligohydramnios, polyhydramnios or preterm labor.   Prenatal Complications - Diabetes: gestational. Diabetes is managed by diet.      OB History    Gravida Para Term Preterm AB TAB SAB Ectopic Multiple Living   1              Past Medical History  Diagnosis Date  . Gestational diabetes 12/09/2014   Past Surgical History  Procedure Laterality Date  . Hand surgery     Family History: family history includes Diabetes in her father; Hyperlipidemia in her mother; Hypertension in her mother. Social History:  reports that she has never smoked. She has never used smokeless tobacco. She reports that she drinks alcohol. She reports that she does not use illicit drugs.   Prenatal Transfer Tool  Maternal Diabetes: Yes:  Diabetes Type:  Diet controlled Genetic Screening: Normal Maternal Ultrasounds/Referrals: Normal Fetal Ultrasounds or other Referrals:  None Maternal Substance Abuse:  No Significant Maternal Medications:  None Significant Maternal Lab Results:  Lab values include: Group B Strep negative Other Comments:  None  Review of Systems  Constitutional: Negative for fever, chills and malaise/fatigue.  Respiratory: Negative for shortness of breath.   Cardiovascular: Negative for leg swelling.  Gastrointestinal: Positive for abdominal pain. Negative for nausea, vomiting, diarrhea and constipation.  Genitourinary: Negative for dysuria.  Neurological: Negative for dizziness and headaches.    Dilation: 10 Effacement (%): 90 Station: 0 Exam by:: meghan garrison rn Blood pressure  132/79, pulse 77, temperature 97.5 F (36.4 C), temperature source Oral, resp. rate 16, height 5' (1.524 m), weight 67.671 kg (149 lb 3 oz), last menstrual period 05/18/2014. Maternal Exam:  Uterine Assessment: Contraction strength is firm.  Contraction frequency is regular.   Abdomen: Patient reports no abdominal tenderness. Fundal height is 37.   Estimated fetal weight is 7.   Fetal presentation: vertex  Introitus: Normal vulva. Normal vagina.  Vagina is negative for discharge.  Ferning test: not done.  Nitrazine test: not done. Amniotic fluid character: not assessed.  Pelvis: adequate for delivery.   Cervix: Cervix evaluated by digital exam.     Fetal Exam Fetal Monitor Review: Mode: ultrasound.   Baseline rate: 140.  Variability: moderate (6-25 bpm).   Pattern: accelerations present and no decelerations.    Fetal State Assessment: Category I - tracings are normal.     Physical Exam  Constitutional: She is oriented to person, place, and time. She appears well-developed and well-nourished. No distress.  HENT:  Head: Normocephalic.  Cardiovascular: Normal rate and regular rhythm.   Respiratory: Breath sounds normal. No respiratory distress.  GI: Soft. Bowel sounds are normal. She exhibits no distension. There is no tenderness. There is no rebound and no guarding.  Genitourinary: Vagina normal. No vaginal discharge found.  Cervix 5cm  Musculoskeletal: Normal range of motion. She exhibits no edema.  Neurological: She is alert and oriented to person, place, and time.  Skin: Skin is warm and dry.  Psychiatric: She has a normal mood and affect.    Prenatal labs: ABO, Rh: --/--/O POS (12/16 1300) Antibody: NEG (12/16 1300) Rubella:  9.23 (06/10 0938) RPR: NON REAC (10/05 1025)  HBsAg: NEGATIVE (06/10 3419)  HIV: NONREACTIVE (10/05 1025)  GBS:     Assessment/Plan: SIUP at [redacted]w[redacted]d Active Labor Diet controlled Gestational Diabetes  [facility]:   [facility]              Routine orders             Patient was told by Dr NErnestina Patchesshe could have a waterbirth, despite GDM.  Upon discussion with Dr ARoselie Awkwardand other midwives, it is acceptable to do it when the DM is well controlled A1 as it is felt to be a low risk pregnancy.  Patient has copy of her certificate (was not scanned in) and will sign waterbirth consent here today (had never signed one).   They do not have a tub, so will let them use ours. They do have a kit.             Anticipate SVD  [facility]12/16/2016, 3:47 PM

## 2015-02-25 NOTE — MAU Note (Signed)
Patient presetns with c/o ctx since 0730 this morning. Patient says she has had white liquid discharge and scant blood. Fetus active

## 2015-02-26 ENCOUNTER — Encounter: Payer: Self-pay | Admitting: Family Medicine

## 2015-02-26 DIAGNOSIS — O99820 Streptococcus B carrier state complicating pregnancy: Secondary | ICD-10-CM | POA: Insufficient documentation

## 2015-02-26 LAB — RPR: RPR: NONREACTIVE

## 2015-02-26 NOTE — Progress Notes (Signed)
Checked on patients needs. Ordered patients dinner. °Spanish Interpreter  °

## 2015-02-26 NOTE — Lactation Note (Signed)
This note was copied from the chart of Boy Ronn MelenaGrecia Rochelle Cruz Luis. Lactation Consultation Note  Patient Name: Boy Ronn MelenaGrecia Rochelle Cruz Luis Today's Date: 02/26/2015 Reason for consult: Initial assessment  Baby 24 hours old. Baby having blood drawn when this LC entered room, demonstrated how to soothe baby by allowing baby to suckle this LC's gloved finger. Parents state that baby just finished nursing for 25 minutes prior to blood draw. Parents report that they heard swallows while baby nursing and mom is able to hand express colostrum. Mom asked about her nipples. Mom says baby is favoring her left breast. The tips of mom's nipples are slightly inverted, right more than left. Assisted mom with position on right breast and demonstrated football because mom has been using cross-cradle. Baby not cueing to nurse and not interested in latching. But mom states that this position is comfortable. Enc mom to support breast while latching. Enc mom to nurse with cues and ask for assistance with latching as needed. Mom give [facility] with review, aware of OP/BFSG and LC phone line assistance after D/C.  Maternal Data Has patient been taught Hand Expression?: Yes Does the patient have breastfeeding experience prior to this delivery?: No  Feeding Feeding Type: Breast Fed Length of feed: 0 min  LATCH Score/Interventions Latch: Too sleepy or reluctant, no latch achieved, no sucking elicited.  Audible Swallowing: None  Type of Nipple: Everted at rest and after stimulation (tips of nipple slighty invert--right more than left.)  Comfort (Breast/Nipple): Soft / non-tender     Hold (Positioning): Assistance needed to correctly position infant at breast and maintain latch. Intervention(s): Breastfeeding basics reviewed;Support Pillows;Position options  LATCH Score: 5  Lactation Tools Discussed/Used     Consult Status Consult Status: Follow-up Date: 02/27/15 Follow-up type:  In-patient    Geralynn OchsWILLIARD, Summit Borchardt 02/26/2015, 3:23 PM

## 2015-02-26 NOTE — Progress Notes (Signed)
Checked on patients needs.  Ordered patients breakfast. °Spanish Interpreter  °

## 2015-02-26 NOTE — Progress Notes (Signed)
Post Partum Day 1 Subjective: no complaints, up ad lib, voiding, tolerating PO and + flatus  Objective: Blood pressure 101/56, pulse 88, temperature 97.6 F (36.4 C), temperature source Oral, resp. rate 20, height 5' (1.524 m), weight 67.671 kg (149 lb 3 oz), last menstrual period 05/18/2014, unknown if currently breastfeeding.  Physical Exam:  General: alert, cooperative and no distress Lochia: appropriate Uterine Fundus: firm Incision: n/a DVT Evaluation: No evidence of DVT seen on physical exam. Negative Homan's sign. No cords or calf tenderness. No significant calf/ankle edema.   Recent Labs  02/25/15 1300  HGB 14.4  HCT 41.1    Assessment/Plan: Plan for discharge tomorrow, Breastfeeding and Contraception undecided, discussed LARCs as most effective   LOS: 1 day   LEFTWICH-KIRBY, Kylle Lall 02/26/2015, 7:47 AM

## 2015-02-26 NOTE — Lactation Note (Addendum)
This note was copied from the chart of Elizabeth [name]. Lactation Consultation Note  Baby has not breastfed since 1500, double phototherapy and sleepy. Reviewed hand expression and gave baby drop on spoon to interest him in latching. Baby breastfed for approx 25 min both breasts. Sucks and some swallows w/ stimulation. Offered to set up DEBP to stimulate her milk supply but mother refuses at this time. Provided her w/ hand pump and encouraged her to prepump and hand express. Recommend that she place baby STS if he does not wake after 3 hours and then breastfeed. Spoke w Murphy OilHolly RN and suggest if he continues to be sleepy at the breast to encourage her to pump w/ DEBP. Gave mother comfort gels since mother has abrasions on tip of nipples apply ebm. Baby had shallow latch. Demonstrated how to bring him deeper on the breast.  Patient Name: Elizabeth [name] Today's Date: 02/26/2015 Reason for consult: Follow-up assessment   Maternal Data    Feeding Feeding Type: Breast Fed Length of feed: 25 min  LATCH Score/Interventions Latch: Repeated attempts needed to sustain latch, nipple held in mouth throughout feeding, stimulation needed to elicit sucking reflex.  Audible Swallowing: A few with stimulation Intervention(s): Hand expression;Skin to skin  Type of Nipple: Everted at rest and after stimulation  Comfort (Breast/Nipple): Filling, red/small blisters or bruises, mild/mod discomfort     Hold (Positioning): Assistance needed to correctly position infant at breast and maintain latch.  LATCH Score: 6  Lactation Tools Discussed/Used     Consult Status Consult Status: Follow-up Date: 02/27/15 Follow-up type: In-patient    [name], [name] 02/26/2015, 8:34 PM

## 2015-02-27 MED ORDER — IBUPROFEN 600 MG PO TABS: 600.0000 mg | ORAL_TABLET | Freq: Four times a day (QID) | ORAL | Status: DC

## 2015-02-27 NOTE — Discharge Instructions (Signed)

## 2015-02-27 NOTE — Discharge Summary (Signed)
OB Discharge Summary     Patient Name: Elizabeth [name] DOB: 1986/01/15 MRN: [id number]  Date of admission: 02/25/2015 Delivering MD: Aviva Signs   Date of discharge: 02/27/2015  Admitting diagnosis: 37WKS,CTX Intrauterine pregnancy: [redacted]w[redacted]d     Secondary diagnosis:  Principal Problem:   NSVD (normal spontaneous vaginal delivery) Active Problems:   Gestational diabetes   Active labor at term   GBS (group B Streptococcus carrier), +RV culture, currently pregnant   Obstetrical laceration, second degree  Additional problems: none     Discharge diagnosis: Term Pregnancy Delivered                                                                                                Post partum procedures:None  Augmentation: none  Complications: None  Hospital course:  Onset of Labor With Vaginal Delivery     29 y.o. yo G1P1001 at [redacted]w[redacted]d was admitted in Active Laboron 02/25/2015. Patient had an uncomplicated labor course as follows:  Membrane Rupture Time/Date: 2:32 PM ,02/25/2015   Intrapartum Procedures: Episiotomy:                                           Lacerations:  2nd degree [3]  Patient had a delivery of a Viable infant. 02/25/2015  Information for the patient's newborn:  Elizabeth [name], Boy Elizabeth [name] [[id number]]  Delivery Method: Vaginal, Spontaneous Delivery (Filed from Delivery Summary)   Pateint had an uncomplicated postpartum course.  She is ambulating, tolerating a regular diet, passing flatus, and urinating well. Patient is discharged home in stable condition on 02/27/2015     Physical exam  Filed Vitals:   02/25/15 2205 02/26/15 0600 02/26/15 1822 02/27/15 0614  BP: 115/71 101/56 114/68 101/56  Pulse: 81 88 94 73  Temp: 98.4 F (36.9 C) 97.6 F (36.4 C) 97.9 F (36.6 C) 98.3 F (36.8 C)  TempSrc:  Oral Oral Oral  Resp: Height:      Weight:       General: alert, cooperative and no distress Lochia: appropriate Uterine  Fundus: firm Incision: N/A DVT Evaluation: No evidence of DVT seen on physical exam. Negative Homan's sign. No cords or calf tenderness. Labs: Lab Results  Component Value Date   WBC 8.1 02/25/2015   HGB 14.4 02/25/2015   HCT 41.1 02/25/2015   MCV 89.0 02/25/2015   PLT 212 02/25/2015   No flowsheet data found.  Discharge instruction: per After Visit Summary and "Baby and Me Booklet".  After visit meds:    Medication List    TAKE these medications        GNP PRENATAL VITAMINS 28-0.8 MG Tabs  Take 1 capsule by mouth daily.     ibuprofen 600 MG tablet  Commonly known as:  ADVIL,MOTRIN  Take 1 tablet (600 mg total) by mouth every 6 (six) hours.        Diet: routine diet  Activity: Advance as tolerated. Pelvic rest for 6 weeks.  Outpatient follow up:6 weeks Follow up Appt:Future Appointments Date Time Provider Department Center  02/28/2015 9:45 AM Elizabeth FlakeKimberly Niles Bertice Risse, MD [facility]   Follow up Visit:No Follow-up on file.  Postpartum contraception: Combination OCPs  Newborn Data: Live born female  Birth Weight: 7 lb 5.6 oz (3334 g) APGAR: 9, 9  Baby Feeding: Breast Disposition:home with mother   02/27/2015 Elizabeth FlakeKimberly Niles Elizabeth Gronau, MD

## 2015-02-27 NOTE — Lactation Note (Signed)
This note was copied from the chart of Elizabeth [name]. Lactation Consultation Note  Patient Name: Elizabeth [name] ZOXWR'UToday's Date: 02/27/2015 Reason for consult: Follow-up assessment Infant is 445 hours old [facility]. Infant was asleep in crib when [facility] entered and mom reported that he last fed ~11am for ~30 mins in the cross-cradle hold. When asked if she could hear swallowing, mom reported that she did not. When asked about pain/ discomfort, mom reported that she did feel some discomfort and that it felt like he was biting her. Encouraged mom to continue to fed on cue and to put skin-to-skin if he has not shown interest in ~3hrs after prior feed. Also encouraged mom to alternate positions (football, cross-cradle, etc.). Mom reports she is comfortable with hand expression (mom was eating & had guests in room so LC did not have mom demonstrate). [facility]/ assessment.   Maternal Data    Feeding Feeding Type: Breast Fed Length of feed: 30 min (per mom)  LATCH Score/Interventions                      Lactation Tools Discussed/Used     Consult Status Consult Status: Follow-up Date: 02/28/15 Follow-up type: In-patient    Oneal GroutLaura C Chavon Lucarelli 02/27/2015, 12:12 PM

## 2015-02-28 ENCOUNTER — Encounter: Payer: Self-pay | Admitting: Family Medicine

## 2015-02-28 ENCOUNTER — Ambulatory Visit: Payer: Self-pay

## 2015-02-28 NOTE — Lactation Note (Signed)
This note was copied from the chart of Elizabeth [name]. Lactation Consultation Note Mom nipples sore wearing comfort gels. Very sleepy. FOB at bedside, speaks English very well. He asked questions about baby weight loss. I told him I was going to hand express and see how much colostrum mom had after BF and pumping. Mom used DEBP and got 1 ml colostrum. Hand expressed 3 ml thick colostrum. Got mom to feel breast before and after hand expression to feel the difference. Rt. Breast softer than Lt. D/t mom BF on Rt. Breast. Encouraged after pumping to hand express colostrum and give to to baby then supplement the lowest # of amount of supplementing on feeding sheet in Spanish. Worked w/FOB and mom using cup measurer, syring, and finger feeding supplement w/gloved finger. Baby on double photo therapy. Had 13 voids and 13 stools at 57 hours. That's a lot and may contribute to some of the weight loss. Talked w/mom and FOB explaining milk may come in w/in next day or so and will not need to supplement hopefully. Mom and dad understands plan for now. Explained that the plans change as situations change. States they understand. Mom Breast feed baby, massaging breast at intervals to express more colostrum. Used DEBP for 15 min, feed baby any colostrum pumped in syring and finger feeding. Hand express after pumping to get more colostrum, give to baby. According to age give baby formula w/curve tip syring.  After moms milk comes in, hopefully will not have to supplement any more. Patient Name: Elizabeth [name] ZOXWR'UToday's Date: 02/28/2015 Reason for consult: Follow-up assessment;Infant weight loss   Maternal Data    Feeding Feeding Type: Breast Milk with Formula added  LATCH Score/Interventions Latch: Grasps breast easily, tongue down, lips flanged, rhythmical sucking. Intervention(s): Teach feeding cues;Waking techniques Intervention(s): Breast massage;Breast compression  Audible  Swallowing: A few with stimulation Intervention(s): Hand expression  Type of Nipple: Everted at rest and after stimulation  Comfort (Breast/Nipple): Filling, red/small blisters or bruises, mild/mod discomfort  Problem noted: Mild/Moderate discomfort Interventions (Mild/moderate discomfort): Comfort gels;Post-pump;Hand massage;Hand expression  Hold (Positioning): No assistance needed to correctly position infant at breast. Intervention(s): Skin to skin;Position options;Support Pillows;Breastfeeding basics reviewed  LATCH Score: 8  Lactation Tools Discussed/Used Tools: Shells;Pump;Comfort gels Shell Type: Inverted Breast pump type: Double-Electric Breast Pump WIC Program: Yes Pump Review: Setup, frequency, and cleaning;Milk Storage Initiated by:: Whittney Work  Date initiated:: 02/28/15   Consult Status Consult Status: Follow-up Date: 03/01/15 Follow-up type: In-patient    Ryszard Socarras, Diamond NickelLAURA G 02/28/2015, 2:32 AM

## 2015-02-28 NOTE — Lactation Note (Signed)
This note was copied from the chart of Boy Ronn MelenaGrecia Rochelle Cruz Luis. Lactation Consultation Note Follow up at 80 hours of age.  Baby is latched with phototherpy in progress.  Baby is vigorous/frantic at the breast with sound of pop off and loosing suction repeatedly.  LC assisted with sandwiching breast tissue and baby unlatched fussy.  Encouraged to burp baby often as baby is arching back.  FOB reports baby has been on and off the breast for 60 minutes with about 25 minutes of feeding.   MOm does not feel she has milk for baby yet and they are syringe feeding formula supplement, but baby has not had any in several hours.  MOm is reporting pain with latch.  FOB finger fed baby 28 mls formula and 4mls of colostrum with syringe feeding.  Baby does not maintain good suction for full finger feeding.  Encouraged mom to limit feedings at the breast with supplement total time of 30 minutes. Advised to supplement 28-42 mls with every feeding at least every 3 hours and to wake baby if baby is sleepy at feeding time.   Plan, Mom to latch baby on demand with feed cues  After hand expression to get colostrum flowing. FOB to supplement with syringe by finger feeding or ask MBU RN about supplement at the breast.  Discussed with FOB allowing baby latch time to empty breast and also supplement with syringe feeding. Mom to post pump for 15 minutes and then work on hand expression.   FOB to keep record of I&Os.    Patient Name: Boy Ronn MelenaGrecia Rochelle Cruz Luis WUJWJ'XToday's Date: 02/28/2015 Reason for consult: Follow-up assessment;Breast/nipple pain   Maternal Data    Feeding Feeding Type: Breast Fed Length of feed: 25 min (on and off for 60 minutes fed 25)  LATCH Score/Interventions Latch: Grasps breast easily, tongue down, lips flanged, rhythmical sucking.  Audible Swallowing: A few with stimulation  Type of Nipple: Everted at rest and after stimulation  Comfort (Breast/Nipple): Filling, red/small blisters or  bruises, mild/mod discomfort     Hold (Positioning): Assistance needed to correctly position infant at breast and maintain latch. Intervention(s): Breastfeeding basics reviewed;Support Pillows;Position options;Skin to skin  LATCH Score: 7  Lactation Tools Discussed/Used     Consult Status Consult Status: Follow-up Date: 03/01/15 Follow-up type: In-patient    Olla Delancey, Arvella MerlesJana Lynn 02/28/2015, 11:16 PM

## 2015-03-01 ENCOUNTER — Ambulatory Visit: Payer: Self-pay

## 2015-03-01 NOTE — Lactation Note (Signed)
This note was copied from the chart of Elizabeth [name]. Lactation Consultation Note  Noted mother's nipples cracked and wearing comfort gels.  Parents called because they were concerned that baby has not stooled in the past 23 hours. Checked infant's diaper and he had small stool.  Parents happy.  Explained to parents that baby had 15 stools in 4 days. Reassured parents and reviewed volume guidelines and showed parents it is time to increase his volume. Mother recently pumped 12 ml of transitional breastmilk. Parents will give the difference with formula. Encouraged them to always breastfeed first before offering formula.  [facility]  [facility]ave family paperwork and [facility]    Patient Name: Elizabeth [name] WGNFA'OToday's Date: 03/01/2015 Reason for consult: Follow-up assessment   Maternal Data    Feeding Feeding Type: Breast Fed  LATCH Score/Interventions                      Lactation Tools Discussed/Used     Consult Status Consult Status: Follow-up Date: 03/01/15 Follow-up type: In-patient    [name], Ruth [facility] 03/01/2015, 6:24 PM

## 2015-03-01 NOTE — Lactation Note (Signed)
This note was copied from the chart of Elizabeth [name]. Lactation Consultation Note  Patient Name: Elizabeth [name] ZOXWR'UToday's Date: 03/01/2015 Reason for consult: Follow-up assessment;Hyperbilirubinemia  Photo tx D/C this am, D/C on hold and will be reassessed for tomorrow am  Per MBU RN Wallie RenshawNatalie Branch per Pedis MD  [facility]. Baby already latched , swallows noted and a nutritive pattern. Baby released and re- latched at 1010 am after LC changed wet diaper. LC assisted with re-positioning and depth at the breast. LC recommended and SNS at the  Breast instead of a curved tip syringe to supplement. Both mom and dad receptive.  LC assisted with latch and added the 39F feeding tube in the side of the mouth.  Baby and mom tolerated well and baby tool 18.5 ml and fed for 23 mins.  Nipple appeared normal when baby  released.  LC plan - with feeding every 2-3 hours supplement with EBM or formula with 39F feeding tube ( 24 ml - 2 syringes )  And post pump both breast for 10 -20 mins and save milk if yield , supplement back.  Also showed dad how to clean tubing. ( 39F feeding tube and syringes)  Mom and dad have a good understanding of plan of care. Also MBU RN Raytheonatalie Branch.    Maternal Data Has patient been taught Hand Expression?: Yes  Feeding Feeding Type: Breast Milk with Formula added Length of feed: 23 min  LATCH Score/Interventions Latch: Grasps breast easily, tongue down, lips flanged, rhythmical sucking. Intervention(s): Skin to skin;Teach feeding cues;Waking techniques Intervention(s): Adjust position;Assist with latch;Breast massage;Breast compression  Audible Swallowing: Spontaneous and intermittent  Type of Nipple: Everted at rest and after stimulation  Comfort (Breast/Nipple): Filling, red/small blisters or bruises, mild/mod discomfort  Problem noted: Filling Interventions (Filling): Reverse pressure  Hold (Positioning):  Assistance needed to correctly position infant at breast and maintain latch. Intervention(s): Breastfeeding basics reviewed;Support Pillows;Position options;Skin to skin  LATCH Score: 8  Lactation Tools Discussed/Used Tools: 39F feeding tube / Syringe   Consult Status Consult Status: Follow-up Date: 03/02/15 Follow-up type: In-patient    Kathrin Greathouseorio, Joesiah Lonon Ann 03/01/2015, 10:52 AM

## 2015-03-02 ENCOUNTER — Ambulatory Visit: Payer: Self-pay

## 2015-03-02 NOTE — Lactation Note (Signed)
This note was copied from the chart of [place]. Lactation Consultation Note  Patient Name: Elizabeth [name] Date: 03/02/2015 Reason for consult: Follow-up assessment  Baby is 5 days out , breast are getting full, warm. Per mom nipples ( left )  Is tender, cracked. Started on the right breast , initially some discomfort , improved  With breast compressions, baby went onto feed 25 mins , multiply swallows, increased  With breast compressions. Nipple appeared well rounded when baby released.  Baby woke back up and switched to the left breast, initially discomfort , improved with  Positioning and breast compressions. Sore nipple and engorgement prevention and tx reviewed . Mom already has comfort gels,  ( replaced due to be cloudy ) , inverted shells, and DEBP kit. LC also showed mom how to use the  Hand pump set up so she can pre-pump after breast massage , hand express. To help sore nipples. LC also referred to Baby and me booklet pages 24 -25.  LC recommended to mom and dad if sore nipples aren't improved by 4 days to call back for O/P appt.  LC also recommended if the nipple is to sore to give tissue a vacation from latching. And feed just on one  Side and supplement.  LC called [place] for mom to see about obtaining a DEBP today . WIC called mom and she has a 1pm appt. Today for a DEBP .  LC mentioned to dad to make sure they take all the pump pieces.  Mother informed of post-discharge support and given phone number to the lactation department, including services for phone call assistance; out-patient appointments; and breastfeeding support group. List of other breastfeeding resources in the community given in the handout. Encouraged mother to call for problems or concerns related to breastfeeding.   Maternal Data    Feeding Feeding Type: Breast Fed Length of feed:  (still feeding at 10 mins , multiply swallows )  LATCH  Score/Interventions Latch: Grasps breast easily, tongue down, lips flanged, rhythmical sucking. Intervention(s): Skin to skin;Teach feeding cues;Waking techniques Intervention(s): Adjust position;Assist with latch;Breast massage;Breast compression  Audible Swallowing: Spontaneous and intermittent  Type of Nipple: Everted at rest and after stimulation  Comfort (Breast/Nipple): Filling, red/small blisters or bruises, mild/mod discomfort  Problem noted: Cracked, bleeding, blisters, bruises;Filling;Mild/Moderate discomfort Interventions (Filling): Hand pump;Reverse pressure  Hold (Positioning): Assistance needed to correctly position infant at breast and maintain latch. Intervention(s): Breastfeeding basics reviewed;Support Pillows;Position options;Skin to skin  LATCH Score: 8  Lactation Tools Discussed/Used Tools: Shells;Pump;Comfort gels Shell Type: Inverted Breast pump type: Manual WIC Program: Yes   Consult Status Consult Status: Complete Date: 03/02/15 Follow-up type: In-patient    Myer Haff 03/02/2015, 10:39 AM

## 2015-03-25 NOTE — Progress Notes (Signed)
Post discharge chart review completed.  

## 2015-04-13 ENCOUNTER — Ambulatory Visit (INDEPENDENT_AMBULATORY_CARE_PROVIDER_SITE_OTHER): Payer: Medicaid Other | Admitting: Obstetrics & Gynecology

## 2015-04-13 ENCOUNTER — Encounter: Payer: Self-pay | Admitting: Obstetrics & Gynecology

## 2015-04-13 DIAGNOSIS — Z3009 Encounter for other general counseling and advice on contraception: Secondary | ICD-10-CM

## 2015-04-13 MED ORDER — NORETHINDRONE 0.35 MG PO TABS: 1.0000 | ORAL_TABLET | Freq: Every day | ORAL | Status: DC

## 2015-04-13 NOTE — Progress Notes (Signed)
Subjective:     Elizabeth [name] is a 30 y.o. female who presents for a postpartum visit. She is 7 weeks postpartum following a NSVD.   I have fully reviewed the prenatal and intrapartum course. The delivery was at 37w 2d gestational weeks, however pt states that her baby was considered premature. Outcome: 2nd degree laceration.  Anesthesia: local. Postpartum course has been uncomplicated. Baby's course has been complicated by jaundice and was hospitalized twice. She was unable to breastfeed for 1 week. Baby is feeding by breast and bottle/formula. Infant is doing well now. Bleeding - none. Bowel function is normal. Bladder function is normal. Patient is notsexually active. Contraception method is desires OCP's. Postpartum depression screening: negative.  She c/o soreness on her left vulva.  Interpreter Nile Riggs present for encounter.   The following portions of the patient's history were reviewed and updated as appropriate: allergies, current medications, past family history, past medical history, past social history, past surgical history and problem list.  Review of Systems Pertinent items are noted in HPI.   Objective:   BP 104/59 mmHg  Pulse 71  Temp(Src) 98.5 F (36.9 C) (Oral)  Resp 20  Ht 5' (1.524 m)  Wt 128 lb 11.2 oz (58.378 kg)  BMI 25.14 kg/m2 Pt in NAD Abd: soft, NT; ND GU: EGBUS: no lesions Vagina: no blood in vault Cervix: no lesion; no mucopurulent d/c Uterus: small, mobile Adnexa: no masses; sl tender    Assessment:     7 weeks postpartum exam. Pap smear not done at today's visit.   Plan:    1. Contraception: POP until HD visit for Depo Provera 2. Follow up in: 1 year or as needed.    Artavis Cowie L. Harraway-Smith, M.D., [place]

## 2015-05-11 ENCOUNTER — Encounter: Payer: Self-pay | Admitting: *Deleted

## 2015-05-24 ENCOUNTER — Encounter (HOSPITAL_COMMUNITY): Payer: Self-pay | Admitting: Emergency Medicine

## 2015-05-24 ENCOUNTER — Emergency Department (HOSPITAL_COMMUNITY): Payer: Self-pay

## 2015-05-24 ENCOUNTER — Emergency Department (HOSPITAL_COMMUNITY): Payer: No Typology Code available for payment source

## 2015-05-24 ENCOUNTER — Emergency Department (HOSPITAL_COMMUNITY)
Admission: EM | Admit: 2015-05-24 | Discharge: 2015-05-25 | Disposition: A | Discharge status: Home / Self Care | Payer: No Typology Code available for payment source | Attending: Emergency Medicine | Admitting: Emergency Medicine

## 2015-05-24 DIAGNOSIS — S3992XA Unspecified injury of lower back, initial encounter: Secondary | ICD-10-CM | POA: Insufficient documentation

## 2015-05-24 DIAGNOSIS — S61412A Laceration without foreign body of left hand, initial encounter: Secondary | ICD-10-CM | POA: Insufficient documentation

## 2015-05-24 DIAGNOSIS — S50312A Abrasion of left elbow, initial encounter: Secondary | ICD-10-CM | POA: Insufficient documentation

## 2015-05-24 DIAGNOSIS — Z3202 Encounter for pregnancy test, result negative: Secondary | ICD-10-CM | POA: Insufficient documentation

## 2015-05-24 DIAGNOSIS — Z8632 Personal history of gestational diabetes: Secondary | ICD-10-CM | POA: Insufficient documentation

## 2015-05-24 DIAGNOSIS — S59912A Unspecified injury of left forearm, initial encounter: Secondary | ICD-10-CM | POA: Insufficient documentation

## 2015-05-24 DIAGNOSIS — S5002XA Contusion of left elbow, initial encounter: Secondary | ICD-10-CM | POA: Insufficient documentation

## 2015-05-24 DIAGNOSIS — Y92410 Unspecified street and highway as the place of occurrence of the external cause: Secondary | ICD-10-CM | POA: Insufficient documentation

## 2015-05-24 DIAGNOSIS — Y998 Other external cause status: Secondary | ICD-10-CM | POA: Insufficient documentation

## 2015-05-24 DIAGNOSIS — S6992XA Unspecified injury of left wrist, hand and finger(s), initial encounter: Secondary | ICD-10-CM | POA: Insufficient documentation

## 2015-05-24 DIAGNOSIS — S0990XA Unspecified injury of head, initial encounter: Secondary | ICD-10-CM | POA: Insufficient documentation

## 2015-05-24 DIAGNOSIS — S60222A Contusion of left hand, initial encounter: Secondary | ICD-10-CM | POA: Insufficient documentation

## 2015-05-24 DIAGNOSIS — S299XXA Unspecified injury of thorax, initial encounter: Secondary | ICD-10-CM | POA: Insufficient documentation

## 2015-05-24 DIAGNOSIS — S199XXA Unspecified injury of neck, initial encounter: Secondary | ICD-10-CM | POA: Insufficient documentation

## 2015-05-24 DIAGNOSIS — Z79899 Other long term (current) drug therapy: Secondary | ICD-10-CM | POA: Insufficient documentation

## 2015-05-24 DIAGNOSIS — Y9389 Activity, other specified: Secondary | ICD-10-CM | POA: Insufficient documentation

## 2015-05-24 LAB — COMPREHENSIVE METABOLIC PANEL
ALK PHOS: 88 U/L (ref 38–126)
ALT: 28 U/L (ref 14–54)
AST: 35 U/L (ref 15–41)
Albumin: 4.7 g/dL (ref 3.5–5.0)
Anion gap: 14 (ref 5–15)
BUN: 14 mg/dL (ref 6–20)
CHLORIDE: 105 mmol/L (ref 101–111)
CO2: 22 mmol/L (ref 22–32)
CREATININE: 0.73 mg/dL (ref 0.44–1.00)
Calcium: 9.8 mg/dL (ref 8.9–10.3)
GLUCOSE: 109 mg/dL — AB (ref 65–99)
Potassium: 3.7 mmol/L (ref 3.5–5.1)
Sodium: 141 mmol/L (ref 135–145)
Total Bilirubin: 0.6 mg/dL (ref 0.3–1.2)
Total Protein: 7.5 g/dL (ref 6.5–8.1)

## 2015-05-24 LAB — PROTIME-INR
INR: 1.04 (ref 0.00–1.49)
Prothrombin Time: 13.8 seconds (ref 11.6–15.2)

## 2015-05-24 LAB — CBC
HEMATOCRIT: 44.5 % (ref 36.0–46.0)
Hemoglobin: 15.5 g/dL — ABNORMAL HIGH (ref 12.0–15.0)
MCH: 28.8 pg (ref 26.0–34.0)
MCHC: 34.8 g/dL (ref 30.0–36.0)
MCV: 82.6 fL (ref 78.0–100.0)
PLATELETS: 284 10*3/uL (ref 150–400)
RBC: 5.39 MIL/uL — ABNORMAL HIGH (ref 3.87–5.11)
RDW: 12.1 % (ref 11.5–15.5)
WBC: 11.6 10*3/uL — AB (ref 4.0–10.5)

## 2015-05-24 LAB — SAMPLE TO BLOOD BANK

## 2015-05-24 LAB — ETHANOL: Alcohol, Ethyl (B): <5 mg/dL (ref <5)

## 2015-05-24 LAB — I-STAT BETA HCG BLOOD, ED (MC, WL, AP ONLY)

## 2015-05-24 MED ORDER — MORPHINE SULFATE (PF) 4 MG/ML IV SOLN
4.0000 mg | Freq: Once | INTRAVENOUS | Status: AC
  Administered 2015-05-25: 4 mg via INTRAVENOUS

## 2015-05-24 MED ORDER — IOHEXOL 300 MG/ML  SOLN
100.0000 mL | Freq: Once | INTRAMUSCULAR | Status: AC | PRN
  Administered 2015-05-24: 100 mL via INTRAVENOUS

## 2015-05-24 MED ORDER — MORPHINE SULFATE (PF) 4 MG/ML IV SOLN
4.0000 mg | Freq: Once | INTRAVENOUS | Status: DC
  Filled 2015-05-24: qty 1

## 2015-05-24 MED ORDER — ONDANSETRON HCL 4 MG/2ML IJ SOLN
4.0000 mg | Freq: Once | INTRAMUSCULAR | Status: AC
  Administered 2015-05-25: 4 mg via INTRAVENOUS
  Filled 2015-05-24: qty 2

## 2015-05-24 NOTE — ED Notes (Signed)
Pt refusing pain medications because she states she is breast feeding and does not want to take any medicines.
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
## 2015-05-24 NOTE — ED Notes (Signed)
Pt transported to Xray. 
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
## 2015-05-25 MED ORDER — CEPHALEXIN 500 MG PO CAPS: 500.0000 mg | ORAL_CAPSULE | Freq: Four times a day (QID) | ORAL | Status: DC

## 2015-05-25 MED ORDER — BACITRACIN ZINC 500 UNIT/GM EX OINT
1.0000 "application " | TOPICAL_OINTMENT | Freq: Two times a day (BID) | CUTANEOUS | Status: DC
  Administered 2015-05-25: 1 via TOPICAL
  Filled 2015-05-25: qty 2.7

## 2015-05-25 MED ORDER — LIDOCAINE-EPINEPHRINE (PF) 2 %-1:200000 IJ SOLN
10.0000 mL | Freq: Once | INTRAMUSCULAR | Status: AC
  Administered 2015-05-25: 10 mL
  Filled 2015-05-25: qty 20

## 2015-05-25 NOTE — Discharge Instructions (Signed)
1. Medications: alternate naprosyn and tylenol for pain control, usual home medications 2. Treatment: rest, ice, elevate and use brace, drink plenty of fluids, gentle stretching; keep wounds clean with warm soap and water, keep the bandages dry 3. Follow Up: Please followup with orthopedics as directed for discussion of your diagnoses and further evaluation after today's visit; if you do not have a primary care doctor use the resource guide provided to find one; Please return to the ER for worsening symptoms or other concerns    Contusin (Contusion) Una contusin es un hematoma profundo. Las contusiones son el resultado de un traumatismo cerrado en los tejidos y las fibras musculares que estn debajo de la piel. La lesin causa una hemorragia debajo de la piel. La [employer] contusin puede tornarse de color [place], morado o [place]. Las lesiones menores causarn contusiones sin Engineer, mining, Biomedical engineer las ms graves pueden presentar dolor e inflamacin durante un par de semanas.  CAUSAS  Generalmente, esta afeccin se debe a un golpe, un traumatismo o una fuerza directa en una zona del cuerpo. SNTOMAS  Los sntomas de esta afeccin incluyen lo siguiente:  Hinchazn de la zona lesionada.  Dolor y sensibilidad en la zona de la lesin.  Cambio de color. La zona puede enrojecerse y [employer], [place] o [place]. DIAGNSTICO  Esta afeccin se diagnostica en funcin de un examen fsico y de la historia clnica. Puede ser necesario hacer una radiografa, una tomografa computarizada (TC) o una resonancia magntica (RM) para determinar si hubo lesiones asociadas, como huesos rotos (fracturas). TRATAMIENTO  El tratamiento especfico de esta afeccin depender de la zona del cuerpo donde se produjo la lesin. En general, el mejor tratamiento para una contusin es el reposo, la aplicacin de hielo, la compresin y la elevacin de la zona de la lesin. Generalmente, esto se conoce como la estrategia de  RHCE. Para Human resources officer, tambin pueden recomendarle antiinflamatorios de [place].  INSTRUCCIONES PARA EL CUIDADO EN EL HOGAR   Mantenga la zona de la lesin en reposo.  Si se lo indican, aplique hielo sobre la zona lesionada:  Ponga el hielo en una bolsa plstica.  Coloque una toalla entre la piel y la bolsa de hielo.  Coloque el hielo durante , 2 a 3veces por Futures trader.  Si se lo indican, ejerza una compresin suave en la zona de la lesin con una venda elstica. Asegrese de que la venda no est [place]. [place] y vuelva a colocarse la venda como se lo haya indicado el mdico.  Cuando est sentado o acostado, eleve la zona de la lesin por encima del nivel del corazn, si es posible.  Tome los medicamentos de venta libre y los recetados solamente como se lo haya indicado el mdico. SOLICITE ATENCIN MDICA SI:  Los sntomas no mejoran despus de [address] de [place].  Los sntomas empeoran.  Tiene dificultad para mover la zona lesionada. SOLICITE ATENCIN MDICA DE INMEDIATO SI:   Siente dolor intenso.  Siente adormecimiento en una mano o un pie.  La mano o el pie estn plidos o fros.   Esta informacin no tiene Theme park manager el consejo del mdico. Asegrese de hacerle al mdico cualquier pregunta que tenga.   Document Released: 12/06/2004 Document Revised: 11/17/2014 Elsevier Interactive Patient Education 2016 [employer].   Crioterapia  (Cryotherapy)  El trmino crioterapia significa tratamiento mediante el fro. Bolsas con hielo o gel se utilizan para reducir Chief Technology Officer y la inflamacin. El hielo es ms  efectivo dentro de las primeras 24 a 48 horas despus de una lesin o trastornos por uso excesivo de un msculo o Risk analyst. El hielo puede calmar esguinces, distensiones, espasmos, ardor, dolor punzante y [employer]. Tambin puede usarse para la recuperacin luego de [place]. El hielo es [place], tiene muy pocos efectos  adversos y es seguro para que lo utilicen la mayora de [employer].  PRECAUCIONES  El hielo no es una opcin segura de tratamiento para las personas con:   Fenmeno de [place]. Este es un trastorno que afecta los vasos sanguneos pequeos en las extremidades. La exposicin al fro [employer] problemas vuelvan.  Hipersensibilidad al fro. Hay diferentes tipos de hipersensibilidad al fro, [employer] se incluyen:  Urticaria por el fro. Ronchas rojas y que pican que aparecen en la piel cuando los tejidos comienzan a calentarse despus de recibir el fro.  Eritema por fro. Se trata de una erupcin de color rojo y que pica, causada por la exposicin al fro.  Hemoglobinuria por fro. Los glbulos rojos se destruyen cuando los tejidos comienzan a calentarse despus de enfriarse. La hemoglobina que transporta oxgeno pasa a la orina debido a que no se puede combinar con protenas de la sangre lo suficientemente rpido.  Entumecimiento o alteracin de la sensibilidad en el rea que se enfra. Si usted tiene [employer], no utilice hielo hasta que haya hablado con su mdico:   Enfermedades cardacas, como arritmias, angina o enfermedad cardaca crnica.  Hipertensin arterial.  Heridas que se estn curando o abiertas en la zona en la que va a aplicar el hielo.  Infecciones actuales.  Artritis reumatoidea.  Mala circulacin.  Diabetes. El hielo disminuye el flujo de sangre en la regin en la que se aplica. Esto es beneficioso cuando se trata de evitar que se propaguen ciertas sustancias qumicas irritantes desde los tejidos inflamados a los tejidos circundantes. Sin embargo, si se expone la piel a las temperaturas fras durante demasiado tiempo o sin la proteccin [place], puede daarse la piel o los nervios. Observe si hay seales de dao en la piel debido al fro.  INSTRUCCIONES PARA EL CUIDADO EN EL HOGAR  Siga estos consejos para usar hielo y  compresas fras con seguridad.   Coloque una toalla seca o hmeda entre el hielo y la piel. Una toalla hmeda se enfriar ms rpidamente la piel, lo que puede hacer necesario acortar el tiempo que se utiliza el hielo.  Para obtener una respuesta ms rpida, puede comprimir suavemente el hielo.  Aplique el hielo durante no ms de 10 a 20 minutos a la vez. Cuanto ms hueso haya en la zona en la que aplique el hielo, menos tiempo se necesitar para obtener los beneficios.  Revise su piel despus de 5 minutos para asegurarse de que no hay seales de [employer] al fro o un dao en la piel.  Descanse 20 minutos o ms [employer].  Una vez que la piel est adormecida, puede finalizar el [place]. Puede probar si hay adormecimiento tocando ligeramente la piel. El toque debe ser tan ligero que no deje un hoyuelo en la piel por la presin hecha con la punta del dedo. Al aplicar hielo, la [employer] de las personas sentirn sensaciones normales en este orden: fro, ardor, dolor y entumecimiento.  No use hielo sobre alguien que no puede comunicar sus respuestas al dolor, como los nios pequeos o personas con demencia. CMO HACER UNA COMPRESA DE  HIELO  Las compresas de hielo son el modo ms frecuente de Chemical engineerutilizar la terapia con hielo. Otros mtodos son los masajes con hielo, baos de hielo, y aerosol fro. Las cremas musculares que producen fro, sensacin de hormigueo no ofrecen los mismos beneficios que ofrece el hielo y no debe ser utilizado como un sustituto excepto que lo recomiende su mdico.  Para hacer una compresa de hielo, haga lo siguiente:   Ponga hielo picado o una bolsa de verduras congeladas en una bolsa de plstico con cierre. Extraiga el exceso de [place]. Coloque esta bolsa dentro de [place] bolsa de plstico. Deslice la bolsa en una funda de almohada o coloque una toalla hmeda entre su piel y la [place].  Mezcle 3 partes de agua con 1 parte de alcohol fino. Congelar la mezcla  en una bolsa plstica con cierre. Cuando se retira Set designerla mezcla del Electrical engineercongelador, tendr un aspecto fangoso. Extraiga el exceso de [place]. Coloque esta bolsa dentro de [place] bolsa de plstico. Deslice la bolsa en una funda de almohada o coloque una toalla hmeda entre su piel y la [place]a. SOLICITE ATENCIN MDICA SI:   Tiene manchas blancas en la piel. Esto puede dar a la piel una apariencia (moteada).  Su piel se vuelve azul o plida.  Tiene un aspecto ceroso o est dura.  La hinchazn empeora. ASEGRESE DE QUE:   Comprende estas instrucciones.  Controlar su enfermedad.  Solicitar ayuda de inmediato si no mejora o si empeora.   Esta informacin no tiene Theme park managercomo fin reemplazar el consejo del mdico. Asegrese de hacerle al mdico cualquier pregunta que tenga.   Document Released: 02/15/2011 Document Revised: 05/21/2011 Elsevier Interactive Patient Education [employer]2016 Elsevier Inc.

## 2015-05-26 LAB — CDS SEROLOGY

## 2017-02-04 ENCOUNTER — Ambulatory Visit: Payer: Self-pay

## 2017-02-04 ENCOUNTER — Ambulatory Visit (INDEPENDENT_AMBULATORY_CARE_PROVIDER_SITE_OTHER): Payer: Managed Care, Other (non HMO) | Admitting: Family Medicine

## 2017-02-04 ENCOUNTER — Encounter: Payer: Self-pay | Admitting: Family Medicine

## 2017-02-04 VITALS — BP 119/68 | HR 77 | Wt 133.4 lb

## 2017-02-04 DIAGNOSIS — Z124 Encounter for screening for malignant neoplasm of cervix: Secondary | ICD-10-CM

## 2017-02-04 DIAGNOSIS — Z8632 Personal history of gestational diabetes: Secondary | ICD-10-CM | POA: Insufficient documentation

## 2017-02-04 DIAGNOSIS — Z3490 Encounter for supervision of normal pregnancy, unspecified, unspecified trimester: Secondary | ICD-10-CM | POA: Insufficient documentation

## 2017-02-04 DIAGNOSIS — O3680X0 Pregnancy with inconclusive fetal viability, not applicable or unspecified: Secondary | ICD-10-CM

## 2017-02-04 DIAGNOSIS — Z113 Encounter for screening for infections with a predominantly sexual mode of transmission: Secondary | ICD-10-CM

## 2017-02-04 DIAGNOSIS — Z1151 Encounter for screening for human papillomavirus (HPV): Secondary | ICD-10-CM

## 2017-02-04 DIAGNOSIS — Z3481 Encounter for supervision of other normal pregnancy, first trimester: Secondary | ICD-10-CM

## 2017-02-04 NOTE — Progress Notes (Signed)

## 2017-02-04 NOTE — Progress Notes (Signed)
Pt stated having lower abdominal pain

## 2017-02-05 LAB — OBSTETRIC PANEL, INCLUDING HIV
Antibody Screen: NEGATIVE
BASOS ABS: 0 10*3/uL (ref 0.0–0.2)
Basos: 0 %
EOS (ABSOLUTE): 0.1 10*3/uL (ref 0.0–0.4)
Eos: 2 %
HIV Screen 4th Generation wRfx: NONREACTIVE
Hematocrit: 39.2 % (ref 34.0–46.6)
Hemoglobin: 13.8 g/dL (ref 11.1–15.9)
Hepatitis B Surface Ag: NEGATIVE
IMMATURE GRANULOCYTES: 0 %
Immature Grans (Abs): 0 10*3/uL (ref 0.0–0.1)
LYMPHS ABS: 1.8 10*3/uL (ref 0.7–3.1)
LYMPHS: 22 %
MCH: 29.1 pg (ref 26.6–33.0)
MCHC: 35.2 g/dL (ref 31.5–35.7)
MCV: 83 fL (ref 79–97)
MONOS ABS: 0.5 10*3/uL (ref 0.1–0.9)
Monocytes: 6 %
NEUTROS PCT: 70 %
Neutrophils Absolute: 5.6 10*3/uL (ref 1.4–7.0)
PLATELETS: 325 10*3/uL (ref 150–379)
RBC: 4.74 x10E6/uL (ref 3.77–5.28)
RDW: 13.9 % (ref 12.3–15.4)
RH TYPE: POSITIVE
RPR Ser Ql: NONREACTIVE
Rubella Antibodies, IGG: 9.79 index (ref >0.99)
WBC: 8.1 10*3/uL (ref 3.4–10.8)

## 2017-02-05 LAB — GLUCOSE TOLERANCE, 1 HOUR: GLUCOSE, 1HR PP: 158 mg/dL (ref 65–199)

## 2017-02-06 LAB — CYTOLOGY - PAP
CHLAMYDIA, DNA PROBE: NEGATIVE
Diagnosis: NEGATIVE
HPV (WINDOPATH): NOT DETECTED
NEISSERIA GONORRHEA: NEGATIVE

## 2017-02-07 NOTE — Progress Notes (Signed)
PRENATAL VISIT NOTE  Subjective:  Elizabeth [name] is a 31 y.o. G3P1101 at 6270w2d by LMP of 915/18 being seen today for initial prenatal visit.  She is currently monitored for the following issues for this low-risk pregnancy and has Supervision of normal pregnancy and History of gestational diabetes on their problem list.  Patient reports occasional cramping.   . Vag. Bleeding: None.   . Denies leaking of fluid.   The following portions of the patient's history were reviewed and updated as appropriate: allergies, current medications, past family history, past medical history, past social history, past surgical history and problem list. Problem list updated.  OB History  Gravida Para Term Preterm AB Living  3 2 1 1   1   SAB TAB Ectopic Multiple Live Births        0 1    # Outcome Date GA Lbr Len/2nd Weight Sex Delivery Anes PTL Lv  3 Current           2 Term 02/25/15 1441w2d 03:33 / 00:22 7 lb 5.6 oz (3.334 kg) M Vag-Spont Local  LIV  1 Preterm               Past Medical History:  Diagnosis Date  . Gestational diabetes 12/09/2014   diet controlled   Past Surgical History:  Procedure Laterality Date  . HAND SURGERY      Objective:   Vitals:   02/04/17 1501  BP: 119/68  Pulse: 77  Weight: 133 lb 6.4 oz (60.5 kg)    Fetal Status:         Unable to obtain with Doppler. U/S done given this and c/o occasional cramping. FHT 164, see below  General:  Alert, oriented and cooperative. Patient is in no acute distress.  Skin: Skin is warm and dry. No rash noted.   Cardiovascular: Normal heart rate noted  Respiratory: Normal respiratory effort, no problems with respiration noted  Abdomen: Soft, gravid, appropriate for gestational age.  Pain/Pressure: Present     Pelvic: Normal appearing genitalia. On speculum exam, no blood or dicharge. Cervix closed, long. Uterus c/w ~8 wk size        Extremities: Normal range of motion.  Edema: None  Mental Status:  Normal mood and  affect. Normal behavior. Normal judgment and thought content.   US OB Limited ----------------------------------------------------------------------  OBSTETRICS REPORT                      (Signed Final 02/06/2017 12:15 pm) ---------------------------------------------------------------------- Patient Info  ID #:       [id number]                          D.O.B.:  1985/11/22 (31 yrs)  Name:       Elizabeth ShoutsGRECIA ROCHELLE [name]            Visit Date: 02/04/2017 04:22 pm              [name] ---------------------------------------------------------------------- Performed By  Performed By:     Sedalia Mutaiane Day RNC          Ref. Address:     [facility]                                                             [address]                                                             [address]                                                             [place], [place]                                                             [id number]  Attending:        Jaynie CollinsUgonna Anyanwu         [facility]         [facility]                    [facility]  [facility] By[facility]      [facility]                    [facility] ---------------------------------------------------------------------- Orders   #  Description                                 Code   1  [place] LIMITED                               [id number]  ----------------------------------------------------------------------   #  Ordered By               Order #        Accession #    Episode #   1  Jaynie CollinsUGONNA ANYANWU  [id number]      [id number]     [id number]  ---------------------------------------------------------------------- Service(s) Provided   OB Limited                                           [id number]   ---------------------------------------------------------------------- Indications   Less than [redacted] weeks gestation of pregnancy       Z3A.00   Pregnancy with inconclusive fetal viability    O36.80X0  ---------------------------------------------------------------------- Fetal Evaluation  Num Of Fetuses:     1  Preg. Location:     Intrauterine  Gest. Sac:          Intrauterine  Yolk Sac:           Visualized  Fetal Pole:         Visualized  Fetal Heart         168  Rate(bpm):  Cardiac Activity:   Observed ---------------------------------------------------------------------- Biometry  CRL:      14.1  mm     G. Age:  7w 5d                   EDD:   09/18/17 ---------------------------------------------------------------------- Gestational Age  Best:          7w 5d      Det. By:  U/S C R L (02/04/17)     EDD:   09/18/17 ---------------------------------------------------------------------- Impression  Viable IUP ---------------------------------------------------------------------- Recommendations  Continue prenatal care as recommended. ----------------------------------------------------------------------              Jaynie Collins, MD Electronically Signed Final Report   02/06/2017 12:15 pm ----------------------------------------------------------------------    Assessment and Plan:  Pregnancy: G2X5284 at [redacted]w[redacted]d by Ultrasound today.   1. Encounter for supervision of other normal pregnancy in first trimester - Obstetric Panel, Including HIV - Culture, OB Urine - Cytology - PAP - Glucose tolerance, 1 hour  2. History of gestational diabetes - Glucose tolerance, 1 hour  3. Encounter to determine fetal viability of pregnancy, single or unspecified fetus - US OB Limited; Future  Please refer to After Visit Summary for other counseling recommendations.  Return in about 4 weeks (around 03/04/2017).   Raynelle Fanning P. Catherine Cubero, MD OB Fellow  Future Appointments  Date Time Provider  Department Center  03/06/2017  2:20 PM Rennie Plowman [facility] [place]

## 2017-02-08 ENCOUNTER — Telehealth: Payer: Self-pay | Admitting: Family Medicine

## 2017-02-08 DIAGNOSIS — R8271 Bacteriuria: Secondary | ICD-10-CM

## 2017-02-08 DIAGNOSIS — O99810 Abnormal glucose complicating pregnancy: Secondary | ICD-10-CM

## 2017-02-08 DIAGNOSIS — R7309 Other abnormal glucose: Secondary | ICD-10-CM

## 2017-02-08 LAB — URINE CULTURE, OB REFLEX

## 2017-02-08 LAB — CULTURE, OB URINE

## 2017-02-08 MED ORDER — CEPHALEXIN 500 MG PO CAPS: 500.0000 mg | ORAL_CAPSULE | Freq: Two times a day (BID) | ORAL | 0 refills | Status: AC

## 2017-02-08 NOTE — Telephone Encounter (Signed)
Results from 02/04/17 reviewed.  Positive 1-hr glucola - 3-hr GTT and Hgb A1c ordered  GBS bacteruria - UA with >10k group B strep and lactobacillus (50k-100k) - Will treat with Keflex to cover lactobacillus as well, although it may just be contaminant. Rx called into pharmacy.  - Will need GBS intrapartum antibiotic prophylaxis   Routed to clinical pool to notify patient.  Raynelle FanningJulie P. Degele, MD OB Fellow

## 2017-02-27 NOTE — Telephone Encounter (Signed)
Called patient and notified her of results. She has an appointment 12/26. I advised her to come in this week early to have her 3 hr glucose test. Patient voiced understanding. I informed her that she needs to be fasting for this test so it is best to come in early. Patient voiced understanding and is aware that she will be called with her appt day and time. Donato HeinzScataria Malloy to schedule and call her.

## 2017-02-28 ENCOUNTER — Other Ambulatory Visit: Payer: Managed Care, Other (non HMO)

## 2017-02-28 DIAGNOSIS — O99810 Abnormal glucose complicating pregnancy: Secondary | ICD-10-CM

## 2017-03-01 LAB — GESTATIONAL GLUCOSE TOLERANCE
GLUCOSE 1 HOUR GTT: 190 mg/dL — AB (ref 65–179)
GLUCOSE 2 HOUR GTT: 138 mg/dL (ref 65–154)
GLUCOSE 3 HOUR GTT: 108 mg/dL (ref 65–139)
GLUCOSE FASTING: 82 mg/dL (ref 65–94)

## 2017-03-06 ENCOUNTER — Ambulatory Visit (INDEPENDENT_AMBULATORY_CARE_PROVIDER_SITE_OTHER): Payer: Managed Care, Other (non HMO)

## 2017-03-06 VITALS — BP 109/67 | HR 77 | Wt 130.0 lb

## 2017-03-06 DIAGNOSIS — Z23 Encounter for immunization: Secondary | ICD-10-CM

## 2017-03-06 DIAGNOSIS — Z3481 Encounter for supervision of other normal pregnancy, first trimester: Secondary | ICD-10-CM

## 2017-03-06 DIAGNOSIS — Z8632 Personal history of gestational diabetes: Secondary | ICD-10-CM

## 2017-03-06 DIAGNOSIS — R8271 Bacteriuria: Secondary | ICD-10-CM

## 2017-03-06 NOTE — Progress Notes (Signed)
Patient reports falling twice a couple weeks ago during the snow, hitting her side. Denies bleeding Patient reports a lot more pain and cramping with this pregnancy compared to the last Patient declines first trimester screen

## 2017-03-06 NOTE — Progress Notes (Signed)
   PRENATAL VISIT NOTE  Subjective:  Elizabeth [name] is a 31 y.o. G3P1101 at 8134w0d being seen today for ongoing prenatal care.  She is currently monitored for the following issues for this low-risk pregnancy and has Supervision of normal pregnancy; History of gestational diabetes; Group B streptococcal bacteriuria; and Abnormal glucose in pregnancy, antepartum on their problem list.  Patient reports sore throat. Denies any fever, chills or cough.  Contractions: Not present. Vag. Bleeding: None.  Movement: Absent. Denies leaking of fluid.   The following portions of the patient's history were reviewed and updated as appropriate: allergies, current medications, past family history, past medical history, past social history, past surgical history and problem list. Problem list updated.  Objective:   Vitals:   03/06/17 1521  BP: 109/67  Pulse: 77  Weight: 130 lb (59 kg)    Fetal Status: Fetal Heart Rate (bpm): 158   Movement: Absent     General:  Alert, oriented and cooperative. Patient is in no acute distress.  Skin: Skin is warm and dry. No rash noted.   Cardiovascular: Normal heart rate noted  Respiratory: Normal respiratory effort, no problems with respiration noted  Abdomen: Soft, gravid, appropriate for gestational age.  Pain/Pressure: Present     Pelvic: Cervical exam deferred        Extremities: Normal range of motion.  Edema: None  Mental Status:  Normal mood and affect. Normal behavior. Normal judgment and thought content.   Assessment and Plan:  Pregnancy: G3P1101 at 1134w0d  1. Encounter for supervision of other normal pregnancy in first trimester -Safe OTC meds in pregnancy reviewed with patient - Flu Vaccine QUAD 36+ mos IM (Fluarix, Quad PF)  2. History of gestational diabetes -Repeat 3hr GTT at 28 weeks  3. Group B streptococcal bacteriuria -Will treat in labor  Preterm labor symptoms and general obstetric precautions including but not limited to vaginal  bleeding, contractions, leaking of fluid and fetal movement were reviewed in detail with the patient. Please refer to After Visit Summary for other counseling recommendations.  Return in about 4 weeks (around 04/03/2017).  Rolm BookbinderCaroline M Neill, CNM  03/06/17 4:16 PM

## 2017-03-06 NOTE — Patient Instructions (Signed)

## 2017-03-12 NOTE — L&D Delivery Note (Signed)
Delivery Note At 3:08 PM a viable female was delivered via Vaginal, Spontaneous (Presentation:LOA ;  ).  APGAR: 8, 9; weight  pending .   Placenta status: complete, intact .  Cord: nuchal x 2  with the following complications: .  Cord pH: NA  Anesthesia:  Local  Episiotomy: none   Lacerations: 2nd degree;Perineal Suture Repair: 3.0 monocryl  Est. Blood Loss (mL): 300  Mom to postpartum.  Baby to Couplet care / Skin to Skin.  Thressa ShellerHeather Davaughn Hillyard 09/06/2017, 3:59 PM

## 2017-04-03 ENCOUNTER — Ambulatory Visit (INDEPENDENT_AMBULATORY_CARE_PROVIDER_SITE_OTHER): Payer: Self-pay

## 2017-04-03 VITALS — BP 113/72 | HR 90 | Wt 133.0 lb

## 2017-04-03 DIAGNOSIS — Z3481 Encounter for supervision of other normal pregnancy, first trimester: Secondary | ICD-10-CM

## 2017-04-03 MED ORDER — PROMETHAZINE HCL 25 MG PO TABS: 25.0000 mg | ORAL_TABLET | Freq: Four times a day (QID) | ORAL | 1 refills | Status: DC | PRN

## 2017-04-03 NOTE — Progress Notes (Signed)
   PRENATAL VISIT NOTE  Subjective:  Elizabeth [name] is a 32 y.o. G3P1101 at 2930w0d being seen today for ongoing prenatal care.  She is currently monitored for the following issues for this low-risk pregnancy and has Supervision of normal pregnancy; History of gestational diabetes; Group B streptococcal bacteriuria; and Abnormal glucose in pregnancy, antepartum on their problem list.  Patient reports nausea and vomiting. She reports that her symptoms occurs every time after eating. Contractions: Not present. Vag. Bleeding: None.  Movement: Present. Denies leaking of fluid.   The patient discussed birth control options with the team and expressed her desire to get an IUD after delivery.    The following portions of the patient's history were reviewed and updated as appropriate: allergies, current medications, past family history, past medical history, past social history, past surgical history and problem list. Problem list updated.  Objective:   Vitals:   04/03/17 1051  BP: 113/72  Pulse: 90  Weight: 133 lb (60.3 kg)    Fetal Status: Fetal Heart Rate (bpm): 152   Movement: Present     General:  Alert, oriented and cooperative. Patient is in no acute distress.  Skin: Skin is warm and dry. No rash noted.   Cardiovascular: Normal heart rate noted  Respiratory: Normal respiratory effort, no problems with respiration noted  Abdomen: Soft, gravid, appropriate for gestational age.  Pain/Pressure: Present     Pelvic: Cervical exam deferred        Extremities: Normal range of motion.  Edema: None  Mental Status:  Normal mood and affect. Normal behavior. Normal judgment and thought content.   Assessment and Plan:  Pregnancy: G3P1101 at 4430w0d  1. Encounter for supervision of other normal pregnancy in first trimester Patient complains of nausea and vomiting after eating.  Patient was offered PRN phenergan to help with symptoms.  - US MFM OB COMP + 14 WK; scheduled for Feb 8th  -  promethazine (PHENERGAN) 25 MG tablet; Take 1 tablet (25 mg total) by mouth every 6 (six) hours as needed for nausea or vomiting.  Dispense: 30 tablet; Refill: 1  Term labor symptoms and general obstetric precautions including but not limited to vaginal bleeding, contractions, leaking of fluid and fetal movement were reviewed in detail with the patient. Please refer to After Visit Summary for other counseling recommendations.  Return in about 4 weeks (around 05/01/2017) for Return OB visit.   Ames Coupeharles A McLendon, Medical Student  I confirm that I have verified the information documented in the medical student's note and that I have also personally reperformed the physical exam and all medical decision making activities.  Rolm BookbinderCaroline M Ryer Asato, CNM 04/03/17 11:48 AM

## 2017-04-03 NOTE — Patient Instructions (Signed)
Safe Medications in Pregnancy   Acne: Benzoyl Peroxide Salicylic Acid  Backache/Headache: Tylenol: 2 regular strength every 4 hours OR              2 Extra strength every 6 hours  Colds/Coughs/Allergies: Benadryl (alcohol free) 25 mg every 6 hours as needed Breath right strips Claritin Cepacol throat lozenges Chloraseptic throat spray Cold-Eeze- up to three times per day Cough drops, alcohol free Flonase (by prescription only) Guaifenesin Mucinex Robitussin DM (plain only, alcohol free) Saline nasal spray/drops Sudafed (pseudoephedrine) & Actifed ** use only after [redacted] weeks gestation and if you do not have high blood pressure Tylenol Vicks Vaporub Zinc lozenges Zyrtec   Constipation: Colace Ducolax suppositories Fleet enema Glycerin suppositories Metamucil Milk of magnesia Miralax Senokot Smooth move tea  Diarrhea: Kaopectate Imodium A-D  *NO pepto Bismol  Hemorrhoids: Anusol Anusol HC Preparation H Tucks  Indigestion: Tums Maalox Mylanta Zantac  Pepcid  Insomnia: Benadryl (alcohol free) 25mg  every 6 hours as needed Tylenol PM Unisom, no Gelcaps  Leg Cramps: Tums MagGel  Nausea/Vomiting:  Bonine Dramamine Emetrol Ginger extract Sea bands Meclizine  Nausea medication to take during pregnancy:  Unisom (doxylamine succinate 25 mg tablets) Take one tablet daily at bedtime. If symptoms are not adequately controlled, the dose can be increased to a maximum recommended dose of two tablets daily (1/2 tablet in the morning, 1/2 tablet mid-afternoon and one at bedtime). Vitamin B6 100mg  tablets. Take one tablet twice a day (up to 200 mg per day).  Skin Rashes: Aveeno products Benadryl cream or 25mg  every 6 hours as needed Calamine Lotion 1% cortisone cream  Yeast infection: Gyne-lotrimin 7 Monistat 7   **If taking multiple medications, please check labels to avoid duplicating the same active ingredients **take medication as directed on  the label ** Do not exceed 4000 mg of tylenol in 24 hours **Do not take medications that contain aspirin or ibuprofen    CIRCUMCISION  Circumcision is considered an elective/non-medically necessary procedure. There are many reasons parents decide to have their sons circumsized. During the first year of life circumcised males have a reduced risk of urinary tract infections but after this year the rates between circumcised males and uncircumcised males are the same.  It is safe to have your son circumcised outside of the hospital and the places above perform them regularly.    Places to have your son circumcised:    [facility] [phone] $480 by 4 wks  [facility] [phone] $244 by 4 wks  [facility] [phone] $175 by 2 wks  [facility] [phone] $250 by 7 days [facility] [phone] $269 by 4 wks  These prices sometimes change but are roughly what you can expect to pay. Please call and confirm pricing.    AREA PEDIATRIC/FAMILY PRACTICE PHYSICIANS  [facility] [address] [place], [place]  [address] Phone - [phone]   Fax - [phone]  [facility] [address] [place], [place] [address] Phone - [phone]   Fax - [phone]  [facility] AMOS [address] [place], [place]  [address] Phone - [phone]   Fax - [phone]  [facility] [address] [place], [place]  [address] Phone - [phone]   Fax - [phone]  [facility] [address] [place], [place]  [address] Phone - [phone]   Fax - [phone]  [facility] [address], [address] [place], [place]  [address] Phone - [phone]  Fax - [phone]  [facility]  [address] [place], [place]  [address] Phone - [phone]   Fax - [phone]  [facility] AT [facility] [address] [place], [place]  [address] Phone - [phone]   Fax - [phone]  [facility] AT [facility] [address] [place], [place]  [address] Phone - [phone]   Fax - [phone] [facility] AT [place] [address] [place], [place]  [address] Phone - [phone]   Fax - [phone]  [facility] [address] [place], [place]  [address] Phone - [phone]   Fax - [phone]  [facility] [address] [place], [place]  [address] Phone - [phone]   Fax - [phone]  [facility] [address] [place], [place]  [address] Phone - [phone]   Fax - [phone]  [facility] [address], [address] [place], [place]  [address] Phone - [phone]  [facility] [address] [place], [place]  [address] Phone - [phone]   Fax - [phone]  [facility] [address] [place], [place]  [address] Phone - [phone]   Fax - [phone]  [facility] [address] [place], [place]  [address] Phone - [phone]   Fax - [phone]   [facility] [address] [place], [place]  [address] Phone - [phone]   Fax - [phone]   [facility] [address] [place] [address] [place], [place]  [address] Phone - [phone]   Fax - [phone]  [facility] [address] [place], [place]  [address] Phone - [phone]   Fax - [phone]  DAVID RUBIN [address] [place], [place]  [address] Phone - [phone]   Fax - [phone]  [facility] [address] [place], [place]  [address] Phone - [phone]   Fax - [phone]  [place] [facility]  Alita Chyle [address] [place], [place]  [address] Phone - [phone]   Fax - [phone] Gerarda Fraction [address] [place], [place]  [address] Phone - [phone]   Fax - [phone]  [facility] [address] [place], [place]  [address] Phone - [phone]   Fax - [phone]  [facility] - [employer] [address] [place], [place]  [address] Phone - [phone]   Fax - [phone]  [facility] [place] Wyvonne Lenz MD [address] [place] [address] Phone [phone]  Fax [phone]

## 2017-04-11 ENCOUNTER — Encounter (HOSPITAL_COMMUNITY): Payer: Self-pay

## 2017-04-17 ENCOUNTER — Ambulatory Visit (HOSPITAL_COMMUNITY): Payer: No Typology Code available for payment source

## 2017-04-19 ENCOUNTER — Other Ambulatory Visit: Payer: Self-pay

## 2017-04-19 ENCOUNTER — Ambulatory Visit (HOSPITAL_COMMUNITY)
Admission: RE | Admit: 2017-04-19 | Discharge: 2017-04-19 | Disposition: A | Discharge status: Home / Self Care | Payer: Self-pay | Source: Ambulatory Visit

## 2017-04-19 DIAGNOSIS — Z3481 Encounter for supervision of other normal pregnancy, first trimester: Secondary | ICD-10-CM

## 2017-04-19 DIAGNOSIS — Z3A18 18 weeks gestation of pregnancy: Secondary | ICD-10-CM

## 2017-04-19 DIAGNOSIS — Z3689 Encounter for other specified antenatal screening: Secondary | ICD-10-CM

## 2017-05-02 ENCOUNTER — Ambulatory Visit (INDEPENDENT_AMBULATORY_CARE_PROVIDER_SITE_OTHER): Payer: Self-pay | Admitting: Student

## 2017-05-02 ENCOUNTER — Encounter: Payer: Self-pay | Admitting: Medical

## 2017-05-02 VITALS — BP 112/65 | HR 86 | Wt 136.9 lb

## 2017-05-02 DIAGNOSIS — Z3481 Encounter for supervision of other normal pregnancy, first trimester: Secondary | ICD-10-CM
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
## 2017-05-02 NOTE — Patient Instructions (Signed)
Crecimiento del beb durante el embarazo (How a Baby Grows During Pregnancy) El embarazo comienza cuando el semen de un hombre ingresa al vulo de una mujer (fecundacin). Esto ocurre en una de las trompas de Falopio que conecta los ovarios con el tero. Al vulo fecundado se lo denomina embrin hasta que alcanza las 10semanas. A partir de las 10semanas y hasta el momento del parto, se llama feto. El vulo fecundado se desplaza por la trompa de Falopio hasta llegar al tero y luego se implanta en el endometrio y empieza crecer. El feto en crecimiento recibe oxgeno y nutrientes a travs del torrente sanguneo de la embarazada y de los tejidos que se forman (placenta) para la sustentacin fetal. La placenta es el sistema de sustentacin de la vida del feto, proporciona la nutricin y elimina los desechos. Informarse tanto como pueda sobre el embarazo y la forma en que se desarrolla el beb puede ayudarla a disfrutar de la experiencia, y, adems, a que se d cuenta de cundo puede haber un problema y cundo hacer preguntas. CUNTO DURA UN EMBARAZO NORMAL? Generalmente, el embarazo dura 280das, o unas 40semanas. Se divide tres trimestres:  Primer trimestre: desde la semana0 a la13.  Segundo trimestre: desde la semana14 a la27.  Tercer trimestre: desde la semana28 a la40. El da que se considera que el beb est listo para nacer (a trmino) es la fecha prevista de parto. CMO SE DESARROLLA EL BEB MES A MES? Primer mes  El vulo fecundado se implanta dentro del tero.  Algunas clulas formarn la placenta, y otras formarn el feto.  Empiezan a desarrollarse los brazos, las piernas, la mdula espinal, los pulmones y el corazn.  Al final del primer mes, el corazn comienza a latir. Segundo mes  Se forman los huesos, el odo interno, los prpados, las manos y los pies.  Se desarrollan los genitales.  Al final de las 8semanas, todos los rganos importantes estn en  desarrollo. Tercer mes  Se estn formando todos los rganos internos.  Se forman los dientes debajo de las encas.  Empiezan a crecer los huesos y los msculos. La columna vertebral tiene movimiento de flexin.  La piel es transparente.  Empiezan a formarse las uas de las manos y de los pies.  Los brazos y las piernas siguen alargndose, y se desarrollan las manos y los pies.  El feto mide aproximadamente 3pulgadas (7,6cm) de largo. Cuarto mes  La placenta est totalmente formada.  Se han formado los rganos sexuales externos, el cuello, las orejas, las cejas, los prpados y las uas de las manos.  El feto puede or, tragar y mover los brazos y las piernas.  Los riones empiezan a producir orina.  La piel est recubierta por una sustancia sebcea blanca (unto sebceo) y un vello muy fino (lanugo). Quinto mes  El feto se mueve ms y es posible sentirlo por primera vez (da pataditas).  Empieza a dormir y despertarse, y tal vez comience a chuparse el dedo.  Crecen las uas en las puntas de los dedos.  Funciona el rgano del sistema digestivo que produce bilis (vescula biliar) y ayuda a digerir los nutrientes.  Si el beb es nia, tiene vulos en los ovarios. Si el beb es varn, los testculos empiezan a descender hasta el escroto. Sexto mes  Se han formado los pulmones, pero el feto an no puede respirar.  Los ojos se abren. El cerebro sigue desarrollndose.  El beb tiene huellas en los dedos de las manos y   los pies. El cabello del beb se vuelve ms abundante.  A fines del segundo trimestre, el feto mide aproximadamente 9pulgadas (22,9cm) de largo. Sptimo mes  El feto patea y se estira.  Los ojos se han desarrollado lo suficiente como para percibir los cambios de luz.  Las manos pueden hacer movimientos de prensin.  El feto responde a los ruidos. Octavo mes  Todos los rganos, as como los sistemas y aparatos del organismo, estn totalmente  desarrollados y en funcionamiento.  Los huesos se solidifican, y se desarrollan los botones gustativos. Es posible que el feto tenga hipo.  Determinadas regiones del cerebro an se estn desarrollando. El crneo sigue siendo blando. Noveno mes  El feto aumenta aproximadamente libra (230g) cada semana.  Los pulmones estn totalmente desarrollados.  Se desarrollan los hbitos de sueo.  Generalmente, el feto se acomoda con la cabeza hacia abajo (presentacin ceflica de vrtice) en el tero para prepararse para el parto. En cambio, si los glteos se acomodan en esta posicin, el beb est de nalgas.  El feto pesa entre 6 y 9libras (2,72 y 4,08kg) y mide entre 19 y 20pulgadas (48,26 a 50,8cm) de largo. QU PUEDO HACER PARA QUE EL EMBARAZO SEA SANO Y PARA AYUDAR AL BEB A DESARROLLARSE? Comida y bebida  Consuma una dieta saludable. ? Hable con el mdico para asegurarse de que est recibiendo los nutrientes que usted y el beb necesitan. ? Visite www.choosemyplate.gov para obtener ms informacin sobre cmo crear una dieta saludable.  El mdico le aconsejar cul es la cantidad saludable de peso a aumentar durante el embarazo, por lo general, entre 25 y 35libras (11 y 16kg). Puede ser necesario que: ? Aumente ms si tena bajo peso antes de quedar embarazada o si est embarazada de ms de un beb. ? Aumente menos si tena sobrepeso u obesidad cuando qued embarazada. Medicamentos y vitaminas  Tome las vitaminas prenatales como se lo haya indicado el mdico, entre ellas, cido flico, hierro, calcio y vitaminaD, que son importantes para el desarrollo saludable.  Tome los medicamentos solamente como se lo haya indicado el mdico. Lea las etiquetas y consulte al farmacutico o al mdico si puede tomar medicamentos de venta libre, suplementos y medicamentos recetados durante el embarazo. Actividades  Haga actividad fsica como se lo haya aconsejado el mdico. Pdale al mdico que  le recomiende actividades que sean seguras para usted, como caminar o practicar natacin.  No participe en deportes extremos ni extenuantes. Estilo de vida  No beba alcohol.  No consuma ningn producto que contenga tabaco, lo que incluye cigarrillos, tabaco de mascar o cigarrillos electrnicos. Si necesita ayuda para dejar de fumar, consulte al mdico.  No consuma drogas. Seguridad  No se exponga al mercurio, al plomo ni a otros metales pesados. Pregntele al mdico acerca de las fuentes comunes de estos metales pesados.  Evite la infeccin por listeria durante el embarazo. Tome las siguientes precauciones: ? No coma quesos blandos ni fiambres. ? No coma perros calientes, salvo que hayan sido calentados al punto de emitir vapor, por ejemplo, en el microondas. ? No tome leche no pasteurizada.  Evite la infeccin por toxoplasmosis durante el embarazo. Tome las siguientes precauciones: ? No cambie la arena sanitaria del gato, si tiene uno. Pdale a otra persona que lo haga por usted. ? Use guantes de jardinera mientras trabaja en el jardn. Instrucciones generales  Concurra a todas las visitas de control como se lo haya indicado el mdico. Esto es importante. Estas incluyen las visitas   de cuidado prenatal y las pruebas de deteccin.  Mantenga las enfermedades crnicas bajo control. Trabaje en estrecha colaboracin con el mdico para mantener las enfermedades bajo control, por ejemplo, la diabetes. CMO S SI EL BEB SE EST DESARROLLANDO BIEN? En cada visita de cuidado prenatal, el mdico har varios estudios diferentes para controlar su estado de salud y hacer un seguimiento del desarrollo del beb. Estos incluyen los siguientes:  Altura uterina. ? El mdico le medir el vientre en crecimiento desde la parte superior a la inferior con una cinta mtrica. ? Adems, le palpar el vientre para determinar la posicin del beb.  Latido cardaco. ? Una ecografa realizada en el primer  trimestre puede confirmar el embarazo y mostrar un latido cardaco, dependiendo del tiempo de gestacin. ? El mdico controlar la frecuencia cardaca del beb en cada visita de cuidado prenatal. ? A medida que se aproxima la fecha de parto, tal vez se hagan controles habituales de la frecuencia cardaca para garantizar que no haya sufrimiento fetal.  Ecografa del segundo trimestre. ? Esta ecografa controla el desarrollo del beb y tambin indica su sexo. QU DEBO HACER SI TENGO ALGUNA INQUIETUD RESPECTO DEL DESARROLLO DEL BEB? Hable siempre con el mdico si tiene alguna inquietud. Esta informacin no tiene como fin reemplazar el consejo del mdico. Asegrese de hacerle al mdico cualquier pregunta que tenga. Document Released: 08/15/2007 Document Revised: 06/20/2015 Document Reviewed: 08/05/2013 Elsevier Interactive Patient Education  2018 Elsevier Inc.  
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
## 2017-06-03 ENCOUNTER — Encounter: Payer: Self-pay | Admitting: Advanced Practice Midwife

## 2017-06-03 ENCOUNTER — Inpatient Hospital Stay (HOSPITAL_COMMUNITY): Payer: Self-pay

## 2017-06-03 ENCOUNTER — Ambulatory Visit (INDEPENDENT_AMBULATORY_CARE_PROVIDER_SITE_OTHER): Payer: Self-pay | Admitting: Advanced Practice Midwife

## 2017-06-03 ENCOUNTER — Inpatient Hospital Stay (HOSPITAL_COMMUNITY)
Admission: AD | Admit: 2017-06-03 | Discharge: 2017-06-03 | Disposition: A | Discharge status: Home / Self Care | Payer: Self-pay | Source: Ambulatory Visit | Attending: Internal Medicine | Admitting: Internal Medicine

## 2017-06-03 ENCOUNTER — Encounter (HOSPITAL_COMMUNITY): Payer: Self-pay

## 2017-06-03 VITALS — BP 116/70 | HR 92 | Wt 140.7 lb

## 2017-06-03 DIAGNOSIS — R109 Unspecified abdominal pain: Secondary | ICD-10-CM | POA: Insufficient documentation

## 2017-06-03 DIAGNOSIS — Z3481 Encounter for supervision of other normal pregnancy, first trimester: Secondary | ICD-10-CM

## 2017-06-03 DIAGNOSIS — Z3A24 24 weeks gestation of pregnancy: Secondary | ICD-10-CM | POA: Insufficient documentation

## 2017-06-03 DIAGNOSIS — Z833 Family history of diabetes mellitus: Secondary | ICD-10-CM | POA: Insufficient documentation

## 2017-06-03 DIAGNOSIS — O24410 Gestational diabetes mellitus in pregnancy, diet controlled: Secondary | ICD-10-CM | POA: Insufficient documentation

## 2017-06-03 DIAGNOSIS — O321XX0 Maternal care for breech presentation, not applicable or unspecified: Secondary | ICD-10-CM | POA: Insufficient documentation

## 2017-06-03 DIAGNOSIS — O9A12 Malignant neoplasm complicating childbirth: Secondary | ICD-10-CM

## 2017-06-03 DIAGNOSIS — Z8249 Family history of ischemic heart disease and other diseases of the circulatory system: Secondary | ICD-10-CM | POA: Insufficient documentation

## 2017-06-03 DIAGNOSIS — X58XXXA Exposure to other specified factors, initial encounter: Secondary | ICD-10-CM | POA: Insufficient documentation

## 2017-06-03 DIAGNOSIS — O9A212 Injury, poisoning and certain other consequences of external causes complicating pregnancy, second trimester: Secondary | ICD-10-CM | POA: Insufficient documentation

## 2017-06-03 MED ORDER — ACETAMINOPHEN 500 MG PO TABS
1000.0000 mg | ORAL_TABLET | Freq: Once | ORAL | Status: AC
  Administered 2017-06-03: 1000 mg via ORAL
  Filled 2017-06-03: qty 2

## 2017-06-03 NOTE — Progress Notes (Signed)
[employer] interpreter Rozetta NunneryYendry [phone]
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
## 2017-06-03 NOTE — Discharge Instructions (Signed)
Qu debo saber acerca de las lesiones durante el embarazo? (What Do I Need to Know About Injuries During Pregnancy?) Los traumatismos son la causa ms frecuente de lesin y [employer], y tambin pueden causar daos importantes o la muerte del beb. En el vientre (tero), el beb est protegido por una bolsa llena de lquido (saco amnitico). Si se produce un traumatismo directo de alto impacto en el abdomen y la pelvis, el beb puede sufrir daos. Este tipo de traumatismo puede causar el desgarro del tero, la separacin de la placenta de la pared uterina (desprendimiento de la placenta) o la rotura del saco amnitico (ruptura de las [place]). Estas lesiones pueden disminuir o interrumpir la irrigacin de sangre al beb, o provocar el trabajo de parto antes de lo previsto. Generalmente, las cadas menores y los accidentes automovilsticos de bajo impacto no daan al beb, incluso si usted sufre lesiones muy leves. QU TIPOS DE LESIONES PUEDEN AFECTAR MI [facility]? Las causas ms frecuentes de lesin o muerte de un beb incluyen lo siguiente:  Cadas. Las cadas son ms frecuentes en el segundo y Recruitment consultant trimestre del Psychiatrist. Los factores que aumentan el riesgo de cadas son: ? Aumento de [place]. ? Cambio en el centro de gravedad. ? Tropezones con un objeto que no puede ver. ? Falta de rigidez (flacidez) de los ligamentos, lo que provoca movimientos menos coordinados (puede sentirse torpe). ? [employer] se realizan actividades de [address], como equitacin o esqu.  Accidentes automovilsticos. Es importante usar Optometrist cinturn de seguridad, con el cinturn del regazo por debajo del abdomen, y conducir siempre con cuidado.  Violencia o agresin domstica.  Quemaduras (por fuego o electricidad). Las causas ms frecuentes de lesiones o muerte de las embarazadas incluyen lo siguiente:  Lesiones que causan una hemorragia grave, shock y la prdida de la irrigacin  sangunea a los principales rganos.  Lesiones en la cabeza o el cuello que causan lesiones cerebrales o espinales graves.  Traumatismos en el trax que pueden causar una lesin directa en el corazn y los pulmones, o cualquier lesin que afecta la zona de las [place]. Los traumatismos en estas zonas pueden causar un paro cardiorrespiratorio. QU PUEDO HACER PARA PROTEGERME Y PROTEGER A MI BEB DE LAS LESIONES MIENTRAS ESTOY EMBARAZADA?  Quite las alfombrillas con las que puede resbalarse y los objetos sueltos en el piso que aumentan el riesgo de tropezones.  No camine sobre pisos mojados o resbalosos.  Use un calzado cmodo con suela de buena adherencia. No use zapatos con tacones altos.  Use siempre el cinturn de seguridad del modo correcto, con el cinturn del regazo por debajo del abdomen, y [place] con cuidado. No ande en motocicleta mientras est embarazada.  No participe en actividades ni practique deportes de alto impacto.  Evite encender fuego, levantar recipientes pesados que contengan lquidos calientes, o reparar problemas elctricos.  Utilice los medicamentos de venta libre o recetados para Primary school teacher, Environmental health practitioner o la fiebre, segn se lo indique el mdico.  Conozca su tipo de [place] y el tipo de sangre del padre en caso de que tenga una hemorragia vaginal o sufra una lesin debido a la cual sea necesaria una transfusin de [place].  Comunquese con el servicio de emergencias de su localidad (911 en los [place]) si es vctima de violencia o agresin domstica. El abuso a Architectural technologist puede ser causa significativa de traumatismo durante el [place]. Para obtener ayuda y apoyo, pngase en contacto con  la Lnea directa nacional para la violencia domstica (National Domestic Violence Hotline).  CUNDO DEBO BUSCAR ASISTENCIA MDICA INMEDIATA?  Se cae sobre el abdomen o sufre un accidente o una lesin muy fuertes.  Sufri una agresin (domstica o de otro  tipo).  Tuvo un accidente automovilstico.  Presenta una hemorragia vaginal abundante.  Tiene prdida de lquido por la vagina.  Siente contracciones uterinas (clicos plvicos, dolor o mucho dolor de espalda).  Se siente dbil, se desmaya o presenta vmitos persistentes luego de cualquier traumatismo.  Sufri una [place] Democratic Republicquemadura grave, por ejemplo, Norfolk Southernquemaduras en el rostro, el cuello, las manos o los genitales, o las quemaduras son ms grandes que el tamao de la palma de la mano en Financial risk analystcualquier otro lugar.  Tiene rigidez o dolor en el cuello despus de una cada o debido a otro traumatismo.  Siente dolor de Turkmenistancabeza o tiene problemas visuales despus de una cada o algn otro traumatismo.  No siente que el beb se mueve o el beb no se mueve tanto como antes de sufrir una cada u otro traumatismo.  Esta informacin no tiene Theme park managercomo fin reemplazar el consejo del mdico. Asegrese de hacerle al mdico cualquier pregunta que tenga. Document Released: 02/26/2005 Document Revised: 03/19/2014 Document Reviewed: 12/03/2012 Elsevier Interactive Patient Education  2017 [employer]

## 2017-06-03 NOTE — MAU Note (Signed)
A. Lahela Woodin, RN assume care of patient.  

## 2017-06-13 ENCOUNTER — Other Ambulatory Visit: Payer: Self-pay | Admitting: Student

## 2017-06-13 ENCOUNTER — Ambulatory Visit (HOSPITAL_COMMUNITY)
Admission: RE | Admit: 2017-06-13 | Discharge: 2017-06-13 | Disposition: A | Discharge status: Home / Self Care | Payer: Self-pay | Source: Ambulatory Visit | Attending: Student | Admitting: Student

## 2017-06-13 ENCOUNTER — Encounter (HOSPITAL_COMMUNITY): Payer: Self-pay

## 2017-06-13 DIAGNOSIS — Z362 Encounter for other antenatal screening follow-up: Secondary | ICD-10-CM

## 2017-06-13 DIAGNOSIS — O09292 Supervision of pregnancy with other poor reproductive or obstetric history, second trimester: Secondary | ICD-10-CM | POA: Insufficient documentation

## 2017-06-13 DIAGNOSIS — Z8632 Personal history of gestational diabetes: Secondary | ICD-10-CM

## 2017-06-13 DIAGNOSIS — Z3481 Encounter for supervision of other normal pregnancy, first trimester: Secondary | ICD-10-CM

## 2017-06-13 DIAGNOSIS — O358XX0 Maternal care for other (suspected) fetal abnormality and damage, not applicable or unspecified: Secondary | ICD-10-CM | POA: Insufficient documentation

## 2017-06-13 DIAGNOSIS — R8271 Bacteriuria: Secondary | ICD-10-CM

## 2017-06-13 DIAGNOSIS — Z3A26 26 weeks gestation of pregnancy: Secondary | ICD-10-CM

## 2017-06-14 ENCOUNTER — Other Ambulatory Visit (HOSPITAL_COMMUNITY): Payer: Self-pay | Admitting: *Deleted

## 2017-06-14 ENCOUNTER — Encounter: Payer: Self-pay | Admitting: Advanced Practice Midwife

## 2017-06-14 DIAGNOSIS — O359XX0 Maternal care for (suspected) fetal abnormality and damage, unspecified, not applicable or unspecified: Secondary | ICD-10-CM

## 2017-06-14 DIAGNOSIS — N1330 Unspecified hydronephrosis: Secondary | ICD-10-CM | POA: Insufficient documentation

## 2017-07-03 ENCOUNTER — Telehealth: Payer: Self-pay | Admitting: General Practice

## 2017-07-03 DIAGNOSIS — O358XX0 Maternal care for other (suspected) fetal abnormality and damage, not applicable or unspecified: Secondary | ICD-10-CM

## 2017-07-03 DIAGNOSIS — O35EXX0 Maternal care for other (suspected) fetal abnormality and damage, fetal genitourinary anomalies, not applicable or unspecified: Secondary | ICD-10-CM

## 2017-07-03 NOTE — Telephone Encounter (Signed)
-----   Message from Armando ReichertHeather D Hogan, CNM sent at 06/14/2017  6:52 PM EDT ----- Patient needs pediatric urology consult. Due to bilateral pyelectasis.

## 2017-07-03 NOTE — Telephone Encounter (Signed)
[facility] [facility]. Called patient with Cicero DuckErika for interpreter & informed her of appt & office address. Discussed appt on 5/16 is for follow up ultrasound and the appt on 5/17 is to talk to a specialist about the baby's kidneys. Patient verbalized understanding to all & asked if this was a problem/dangerous to baby. Told patient I do not know enough about it to discuss that with her but the specialist will determine what follow up if any is needed. Patient verbalized understanding & had no questions

## 2017-07-05 ENCOUNTER — Ambulatory Visit (INDEPENDENT_AMBULATORY_CARE_PROVIDER_SITE_OTHER): Payer: Self-pay | Admitting: Nurse Practitioner

## 2017-07-05 ENCOUNTER — Other Ambulatory Visit: Payer: Self-pay

## 2017-07-05 VITALS — BP 107/65 | HR 80

## 2017-07-05 DIAGNOSIS — R8271 Bacteriuria: Secondary | ICD-10-CM

## 2017-07-05 DIAGNOSIS — Z3483 Encounter for supervision of other normal pregnancy, third trimester: Secondary | ICD-10-CM

## 2017-07-05 DIAGNOSIS — Z3481 Encounter for supervision of other normal pregnancy, first trimester: Secondary | ICD-10-CM

## 2017-07-05 DIAGNOSIS — Z8632 Personal history of gestational diabetes: Secondary | ICD-10-CM

## 2017-07-05 DIAGNOSIS — N1330 Unspecified hydronephrosis: Secondary | ICD-10-CM

## 2017-07-05 NOTE — Patient Instructions (Signed)
Prediabetes Eating Plan Prediabetes-also called impaired glucose tolerance or impaired fasting glucose-is a condition that causes blood sugar (blood glucose) levels to be higher than normal. Following a healthy diet can help to keep prediabetes under control. It can also help to lower the risk of type 2 diabetes and heart disease, which are increased in people who have prediabetes. Along with regular exercise, a healthy diet:  Promotes weight loss.  Helps to control blood sugar levels.  Helps to improve the way that the body uses insulin.  What do I need to know about this eating plan?  Use the glycemic index (GI) to plan your meals. The index tells you how quickly a food will raise your blood sugar. Choose low-GI foods. These foods take a longer time to raise blood sugar.  Pay close attention to the amount of carbohydrates in the food that you eat. Carbohydrates increase blood sugar levels.  Keep track of how many calories you take in. Eating the right amount of calories will help you to achieve a healthy weight. Losing about 7 percent of your starting weight can help to prevent type 2 diabetes.  You may want to follow a Mediterranean diet. This diet includes a lot of vegetables, lean meats or fish, whole grains, fruits, and healthy oils and fats. What foods can I eat? Grains Whole grains, such as whole-wheat or whole-grain breads, crackers, cereals, and pasta. Unsweetened oatmeal. Bulgur. Barley. Quinoa. Brown rice. Corn or whole-wheat flour tortillas or taco shells. Vegetables Lettuce. Spinach. Peas. Beets. Cauliflower. Cabbage. Broccoli. Carrots. Tomatoes. Squash. Eggplant. Herbs. Peppers. Onions. Cucumbers. Brussels sprouts. Fruits Berries. Bananas. Apples. Oranges. Grapes. Papaya. Mango. Pomegranate. Kiwi. Grapefruit. Cherries. Meats and Other Protein Sources Seafood. Lean meats, such as chicken and turkey or lean cuts of pork and beef. Tofu. Eggs. Nuts. Beans. Dairy Low-fat or  fat-free dairy products, such as yogurt, cottage cheese, and cheese. Beverages Water. Tea. Coffee. Sugar-free or diet soda. Seltzer water. Milk. Milk alternatives, such as soy or almond milk. Condiments Mustard. Relish. Low-fat, low-sugar ketchup. Low-fat, low-sugar barbecue sauce. Low-fat or fat-free mayonnaise. Sweets and Desserts Sugar-free or low-fat pudding. Sugar-free or low-fat ice cream and other frozen treats. Fats and Oils Avocado. Walnuts. Olive oil. The items listed above may not be a complete list of recommended foods or beverages. Contact your dietitian for more options. What foods are not recommended? Grains Refined white flour and flour products, such as bread, pasta, snack foods, and cereals. Beverages Sweetened drinks, such as sweet iced tea and soda. Sweets and Desserts Baked goods, such as cake, cupcakes, pastries, cookies, and cheesecake. The items listed above may not be a complete list of foods and beverages to avoid. Contact your dietitian for more information. This information is not intended to replace advice given to you by your health care provider. Make sure you discuss any questions you have with your health care provider. Document Released: 07/13/2014 Document Revised: 08/04/2015 Document Reviewed: 03/24/2014 Elsevier Interactive Patient Education  2017 Elsevier Inc.  

## 2017-07-05 NOTE — Progress Notes (Signed)
    Subjective:  Elizabeth [name] is a 32 y.o. G2P1001 at 8164w2d being seen today for ongoing prenatal care.  She is currently monitored for the following issues for this high-risk pregnancy and has Supervision of normal pregnancy; History of gestational diabetes; Group B streptococcal bacteriuria; Abnormal glucose in pregnancy, antepartum; and Pyelectasis on their problem list.  Patient reports no complaints.  Contractions: Not present. Vag. Bleeding: None.  Movement: Present. Denies leaking of fluid.   The following portions of the patient's history were reviewed and updated as appropriate: allergies, current medications, past family history, past medical history, past social history, past surgical history and problem list. Problem list updated.  Objective:   Vitals:   07/05/17 0838  BP: 107/65  Pulse: 80    Fetal Status: Fetal Heart Rate (bpm): 138 Fundal Height: 30 cm Movement: Present     General:  Alert, oriented and cooperative. Patient is in no acute distress.  Skin: Skin is warm and dry. No rash noted.   Cardiovascular: Normal heart rate noted  Respiratory: Normal respiratory effort, no problems with respiration noted  Abdomen: Soft, gravid, appropriate for gestational age. Pain/Pressure: Absent     Pelvic:  Cervical exam deferred        Extremities: Normal range of motion.  Edema: Trace  Mental Status: Normal mood and affect. Normal behavior. Normal judgment and thought content.   Urinalysis:    NA  Assessment and Plan:  Pregnancy: G2P1001 at 4964w2d  1. Encounter for supervision of other normal pregnancy in third trimester Client doing well.    2. History of gestational diabetes 3 hour glucola done today  3. Group B streptococcal bacteriuria   4. Pyelectasis Client had questions.  Reviewed US results and discussed with her.  [facility] and plans to keep the appointment.  Advised her to write down her questions and ask there for specific  info.  Preterm labor symptoms and general obstetric precautions including but not limited to vaginal bleeding, contractions, leaking of fluid and fetal movement were reviewed in detail with the patient. Please refer to After Visit Summary for other counseling recommendations.  Return in about 2 weeks (around 07/19/2017).  Nolene BernheimERRI Norie Latendresse, RN, MSN, NP-BC Nurse Practitioner, [facility], [facility] 07/05/2017 9:10 AM

## 2017-07-06 LAB — CBC
HEMATOCRIT: 36.3 % (ref 34.0–46.6)
HEMOGLOBIN: 12.3 g/dL (ref 11.1–15.9)
MCH: 29.6 pg (ref 26.6–33.0)
MCHC: 33.9 g/dL (ref 31.5–35.7)
MCV: 87 fL (ref 79–97)
Platelets: 229 10*3/uL (ref 150–379)
RBC: 4.16 x10E6/uL (ref 3.77–5.28)
RDW: 14.1 % (ref 12.3–15.4)
WBC: 6.9 10*3/uL (ref 3.4–10.8)

## 2017-07-06 LAB — GESTATIONAL GLUCOSE TOLERANCE
GLUCOSE 1 HOUR GTT: 194 mg/dL — AB (ref 65–179)
GLUCOSE 2 HOUR GTT: 214 mg/dL — AB (ref 65–154)
Glucose, Fasting: 91 mg/dL (ref 65–94)
Glucose, GTT - 3 Hour: 161 mg/dL — ABNORMAL HIGH (ref 65–139)

## 2017-07-06 LAB — RPR: RPR: NONREACTIVE

## 2017-07-06 LAB — HIV ANTIBODY (ROUTINE TESTING W REFLEX): HIV Screen 4th Generation wRfx: NONREACTIVE

## 2017-07-07 ENCOUNTER — Encounter: Payer: Self-pay | Admitting: Nurse Practitioner

## 2017-07-07 ENCOUNTER — Other Ambulatory Visit: Payer: Self-pay | Admitting: Advanced Practice Midwife

## 2017-07-07 DIAGNOSIS — Z3483 Encounter for supervision of other normal pregnancy, third trimester: Secondary | ICD-10-CM

## 2017-07-08 ENCOUNTER — Other Ambulatory Visit: Payer: Self-pay

## 2017-07-08 DIAGNOSIS — O24419 Gestational diabetes mellitus in pregnancy, unspecified control: Secondary | ICD-10-CM

## 2017-07-08 MED ORDER — GLUCOSE BLOOD VI STRP: ORAL_STRIP | 12 refills | Status: AC

## 2017-07-08 MED ORDER — ACCU-CHEK GUIDE W/DEVICE KIT: 1.0000 | PACK | Freq: Four times a day (QID) | 0 refills | Status: AC

## 2017-07-08 MED ORDER — ACCU-CHEK FASTCLIX LANCETS MISC: 1.0000 [IU] | Freq: Four times a day (QID) | 12 refills | Status: AC

## 2017-07-24 ENCOUNTER — Ambulatory Visit (INDEPENDENT_AMBULATORY_CARE_PROVIDER_SITE_OTHER): Payer: Self-pay

## 2017-07-24 VITALS — BP 108/65 | HR 84 | Wt 144.4 lb

## 2017-07-24 DIAGNOSIS — R8271 Bacteriuria: Secondary | ICD-10-CM

## 2017-07-24 DIAGNOSIS — Z3483 Encounter for supervision of other normal pregnancy, third trimester: Secondary | ICD-10-CM

## 2017-07-24 DIAGNOSIS — Z23 Encounter for immunization: Secondary | ICD-10-CM

## 2017-07-24 DIAGNOSIS — O24419 Gestational diabetes mellitus in pregnancy, unspecified control: Secondary | ICD-10-CM

## 2017-07-24 NOTE — Progress Notes (Signed)
   PRENATAL VISIT NOTE  Subjective:  Elizabeth [name] is a 32 y.o. G2P1001 at [redacted]w[redacted]d being seen today for ongoing prenatal care.  She is currently monitored for the following issues for this high-risk pregnancy and has Gestational diabetes; Supervision of normal pregnancy; History of gestational diabetes; Group B streptococcal bacteriuria; Abnormal glucose in pregnancy, antepartum; and Pyelectasis on their problem list.  Patient reports no complaints.  Contractions: Irritability. Vag. Bleeding: None.  Movement: Present. Denies leaking of fluid.   The following portions of the patient's history were reviewed and updated as appropriate: allergies, current medications, past family history, past medical history, past social history, past surgical history and problem list. Problem list updated.  Objective:   Vitals:   07/24/17 1538  BP: 108/65  Pulse: 84  Weight: 144 lb 6.4 oz (65.5 kg)    Fetal Status: Fetal Heart Rate (bpm): 151 Fundal Height: 32 cm Movement: Present     General:  Alert, oriented and cooperative. Patient is in no acute distress.  Skin: Skin is warm and dry. No rash noted.   Cardiovascular: Normal heart rate noted  Respiratory: Normal respiratory effort, no problems with respiration noted  Abdomen: Soft, gravid, appropriate for gestational age.  Pain/Pressure: Present     Pelvic: Cervical exam deferred        Extremities: Normal range of motion.  Edema: Trace  Mental Status: Normal mood and affect. Normal behavior. Normal judgment and thought content.   Assessment and Plan:  Pregnancy: G2P1001 at [redacted]w[redacted]d  1. Encounter for supervision of other normal pregnancy in third trimester - Patient interested in waterbirth. Discussed importance of going to class as soon as possible. Risks and contraindications reviewed -Patient has appointment with pediatric [facility]rology [facility] on Friday 5/17 - Tdap today  2. Gestational diabetes mellitus (GDM), antepartum, gestational  diabetes method of control unspecified - Patient picked up testing supplies today and has not been taking blood sugars at home. Lengthy discussion about importance of testing blood sugars, bringing log book and keeping appointments. Discussed risks of uncontrolled DM in pregnancy including delayed fetal lung maturity, IUFD and shoulder dystocia possibly resulting brachial plexus palsy, brain damage, intrapartum death and extensive obstetric lacerations. Patient verbalizes understanding.  3. Group B streptococcal bacteriuria  Preterm labor symptoms and general obstetric precautions including but not limited to vaginal bleeding, contractions, leaking of fluid and fetal movement were reviewed in detail with the patient. Please refer to After Visit Summary for other counseling recommendations.  Return in about 2 weeks (around 08/07/2017) for Return OB visit.  Future Appointments  Date Time Provider Department Center  07/25/2017  3:00 PM WH-MFC [place] 3 WH-MFCUS MFC-US  08/07/2017 11:15 AM Judeth Horn, NP [facility] WOC  08/21/2017  1:15 PM Marvetta Gibbons, Brand Males, NP [facility]OC-WOCA WOC  08/28/2017 10:55 AM Judeth Horn, NP [facility]  09/04/2017 11:15 AM Judeth Horn, NP [facility]    [place], [place] 07/24/17 4:00 PM

## 2017-07-24 NOTE — Patient Instructions (Addendum)
Las medicinas seguras para tomar Solicitor  Safe Medications in Pregnancy  Acn:  Benzoyl Peroxide (Perxido de benzolo)  Salicylic Acid (cido saliclico)  Dolor de espalda/Dolor de cabeza:  Tylenol: 2 pastillas de concentracin regular cada 4 horas O 2 pastillas de concentracin fuerte cada 6 horas  Resfriados/Tos/Alergias:  Benadryl (sin alcohol) 25 mg cada 6 horas segn lo necesite Breath Right strips (Tiras para respirar correctamente)  Claritin  Cepacol (pastillas de chupar para la garganta)  Chloraseptic (aerosol para la garganta)  Cold-Eeze- hasta tres veces por da  Cough drops (pastillas de chupar para la tos, sin alcohol)  Flonase (con receta mdica solamente)  Guaifenesin  Mucinex  Robitussin DM (simple solamente, sin alcohol)  Saline nasal spray/drops (Aerosol nasal salino/gotas) Sudafed (pseudoephedrine) y  Actifed * utilizar slo despus de 12 semanas de gestacin y si no tiene la presin arterial alta.  Tylenol Vicks  VapoRub  Zinc lozenges (pastillas para la garganta)  Zyrtec  Estreimiento:  Colace  Ducolax (supositorios)  Fleet enema (lavado intestinal rectal)  Glycerin (supositorios)  Metamucil  Milk of magnesia (leche de magnesia)  Miralax  Senokot  Smooth Move (t)  Diarrea:  Kaopectate Imodium A-D  *NO tome Pepto-Bismol  Hemorroides:  Anusol  Anusol HC  Preparation H  Tucks  Indigestin:  Tums  Maalox  Mylanta  Zantac  Pepcid  Insomnia:  Benadryl (sin alcohol) 15m cada 6 horas segn lo necesite  Tylenol PM  Unisom, no Gelcaps  Calambres en las piernas:  Tums  MagGel Nuseas/Vmitos:  Bonine  Dramamine  Emetrol  Ginger (extracto)  Sea-Bands  Meclizine  Medicina para las nuseas que puede tomar durante el embarazo: Unisom (doxylamine succinate, pastillas de 25 mg) Tome una pastilla al da al aMorgan's Point Resort Si los sntomas no estn adecuadamente controlados, la dosis puede aumentarse hasta una dosis mxima recomendada de dThe St. Paul Travelersal da (1/2 pastilla por la m[place] 1/2 pastilla a media tarde y uArdelia Memspastilla al aFindlay. Pastillas de Vitamina B6 de 1032m Tome unLiberty Mediaeces al da (hasta 200 mg por da).  Erupciones en la piel:  Productos de Aveeno  Benadryl cream (crema o una dosis de 2561mada 6 horas segn lo necesite)  Calamine Lotion (locin)  1% cortisone cream (crema de cortisona de 1%)  nfeccin vaginal por hongos (candidiasis):  Gyne-lotrimin 7  Monistat 7   **Si est tomando varias medicinas, por favor revise las etiquetas para eviConservation officer, natures mismos ingredientes actPlaquemine*Tome la medicina segn lo indicado en la etiqueta. **No tome ms de 400 mg de Tylenol en 24 horas. **No tome medicinas que contengan aspirina o ibuprofeno.    You must attend a [facility]ass [facility] of every month from 7-9pm  FreHarley-Davidson calling [phone] online at [email or web address] us [place] certificate from the class to your prenatal appointment  Meet with a midwife at 36 weeks to see if you can still plan a waterbirth and to sign the consent.   Purchase or rent the following supplies:   Water Birth Pool (Birth Pool in a Box or LaBMandanr instance)  (Tubs start ~$125)  Single-use disposable tub liner designed for your brand of tub  New garden hose labeled "lead-free", "suitable for drinking water",  Electric drain pump to remove water (We recommend 792 gallon per hour or greater pump.)   Separate garden hose to remove the dirty water  Fish net  Bathing suit top (optional)  Long-handled  mirror (optional)  Places to purchase or rent supplies  GotWebTools.is for tub purchases and supplies  Waterbirthsolutions.com for tub purchases and supplies  The Labor Ladies (www.thelaborladies.com) $275 for tub rental/set-up & take down/kit   [employer] (http://www.fleming.com/.htm) Information regarding doulas (labor  support) who provide pool rentals  Our practice has a Birth Pool in a Box tub at the hospital that you may borrow on a first-come-first-served basis. It is your responsibility to to set up, clean and break down the tub. We cannot guarantee the availability of this tub in advance. You are responsible for bringing all accessories listed above. If you do not have all necessary supplies you cannot have a waterbirth.    Things that would prevent you from having a waterbirth:  Premature, <37wks  Previous cesarean birth  Presence of thick meconium-stained fluid  Multiple gestation (Twins, triplets, etc.)  Uncontrolled diabetes or gestational diabetes requiring medication  Hypertension requiring medication or diagnosis of pre-eclampsia  Heavy vaginal bleeding  Non-reassuring fetal heart rate  Active infection (MRSA, etc.). Group B Strep is NOT a contraindication for  waterbirth.  If your labor has to be induced and induction method requires continuous  monitoring of the baby's heart rate  Other risks/issues identified by your obstetrical provider  Please remember that birth is unpredictable. Under certain unforeseeable circumstances your provider may advise against giving birth in the tub. These decisions will be made on a case-by-case basis and with the safety of you and your baby as our highest priority.

## 2017-07-25 ENCOUNTER — Encounter (HOSPITAL_COMMUNITY): Payer: Self-pay

## 2017-07-25 ENCOUNTER — Other Ambulatory Visit (HOSPITAL_COMMUNITY): Payer: Self-pay | Admitting: Maternal and Fetal Medicine

## 2017-07-25 ENCOUNTER — Ambulatory Visit (HOSPITAL_COMMUNITY)
Admission: RE | Admit: 2017-07-25 | Discharge: 2017-07-25 | Disposition: A | Discharge status: Home / Self Care | Payer: Self-pay | Source: Ambulatory Visit | Attending: Student | Admitting: Student

## 2017-07-25 DIAGNOSIS — O359XX0 Maternal care for (suspected) fetal abnormality and damage, unspecified, not applicable or unspecified: Secondary | ICD-10-CM

## 2017-07-25 DIAGNOSIS — Z8632 Personal history of gestational diabetes: Secondary | ICD-10-CM

## 2017-07-25 DIAGNOSIS — Z3A32 32 weeks gestation of pregnancy: Secondary | ICD-10-CM | POA: Insufficient documentation

## 2017-07-25 DIAGNOSIS — O09299 Supervision of pregnancy with other poor reproductive or obstetric history, unspecified trimester: Secondary | ICD-10-CM

## 2017-07-25 DIAGNOSIS — Z3483 Encounter for supervision of other normal pregnancy, third trimester: Secondary | ICD-10-CM

## 2017-07-25 DIAGNOSIS — N1330 Unspecified hydronephrosis: Secondary | ICD-10-CM

## 2017-07-25 DIAGNOSIS — O24410 Gestational diabetes mellitus in pregnancy, diet controlled: Secondary | ICD-10-CM | POA: Insufficient documentation

## 2017-07-25 DIAGNOSIS — R8271 Bacteriuria: Secondary | ICD-10-CM

## 2017-07-25 DIAGNOSIS — O09293 Supervision of pregnancy with other poor reproductive or obstetric history, third trimester: Secondary | ICD-10-CM | POA: Insufficient documentation

## 2017-07-25 DIAGNOSIS — Z362 Encounter for other antenatal screening follow-up: Secondary | ICD-10-CM | POA: Insufficient documentation

## 2017-07-26 ENCOUNTER — Other Ambulatory Visit (HOSPITAL_COMMUNITY): Payer: Self-pay | Admitting: *Deleted

## 2017-07-26 DIAGNOSIS — O358XX0 Maternal care for other (suspected) fetal abnormality and damage, not applicable or unspecified: Secondary | ICD-10-CM

## 2017-07-26 DIAGNOSIS — O35EXX0 Maternal care for other (suspected) fetal abnormality and damage, fetal genitourinary anomalies, not applicable or unspecified: Secondary | ICD-10-CM

## 2017-08-07 ENCOUNTER — Encounter: Payer: Self-pay | Admitting: Student

## 2017-08-08 ENCOUNTER — Ambulatory Visit (INDEPENDENT_AMBULATORY_CARE_PROVIDER_SITE_OTHER): Payer: Self-pay | Admitting: Obstetrics & Gynecology

## 2017-08-08 VITALS — BP 123/70 | HR 98 | Wt 145.1 lb

## 2017-08-08 DIAGNOSIS — Z3483 Encounter for supervision of other normal pregnancy, third trimester: Secondary | ICD-10-CM

## 2017-08-08 DIAGNOSIS — O24410 Gestational diabetes mellitus in pregnancy, diet controlled: Secondary | ICD-10-CM

## 2017-08-08 NOTE — Progress Notes (Signed)
Spanish Interpreter Moise Boring

## 2017-08-08 NOTE — Progress Notes (Signed)
   PRENATAL VISIT NOTE  Subjective:  Elizabeth [name] is a 32 y.o. G2P1001 at [redacted]w[redacted]d being seen today for ongoing prenatal care.  She is currently monitored for the following issues for this high-risk pregnancy and has Gestational diabetes; Supervision of normal pregnancy; History of gestational diabetes; Group B streptococcal bacteriuria; Abnormal glucose in pregnancy, antepartum; and Pyelectasis on their problem list.  Patient reports some cramping for a week.  Contractions: Irregular. Vag. Bleeding: None.  Movement: Present. Denies leaking of fluid.   The following portions of the patient's history were reviewed and updated as appropriate: allergies, current medications, past family history, past medical history, past social history, past surgical history and problem list. Problem list updated.  Objective:   Vitals:   08/08/17 1034  BP: 123/70  Pulse: 98  Weight: 145 lb 1.6 oz (65.8 kg)    Fetal Status: Fetal Heart Rate (bpm): 141   Movement: Present     General:  Alert, oriented and cooperative. Patient is in no acute distress.  Skin: Skin is warm and dry. No rash noted.   Cardiovascular: Normal heart rate noted  Respiratory: Normal respiratory effort, no problems with respiration noted  Abdomen: Soft, gravid, appropriate for gestational age.  Pain/Pressure: Present     Pelvic: Cervical exam performed        Extremities: Normal range of motion.  Edema: Trace  Mental Status: Normal mood and affect. Normal behavior. Normal judgment and thought content.   Assessment and Plan:  Pregnancy: G2P1001 at [redacted]w[redacted]d  1. Encounter for supervision of other normal pregnancy in third trimester - follow up u/s on 08-22-17  2. Diet controlled gestational diabetes mellitus (GDM) in third trimester - excellent sugars on no meds  Preterm labor symptoms and general obstetric precautions including but not limited to vaginal bleeding, contractions, leaking of fluid and fetal movement were  reviewed in detail with the patient. Please refer to After Visit Summary for other counseling recommendations.  No follow-ups on file.  Future Appointments  Date Time Provider Department Center  08/21/2017  1:15 PM Currie Paris, NP WOC-WOC[facility] WOC  08/22/2017  3:30 PM WH-MFC [place] 1 WH-MFCUS MFC-US  08/28/2017 10:55 AM Judeth Horn, NP [facility] WOC  09/04/2017 11:15 AM Judeth Horn, NP [facility] WOC    Allie Bossier, MD

## 2017-08-21 ENCOUNTER — Ambulatory Visit (INDEPENDENT_AMBULATORY_CARE_PROVIDER_SITE_OTHER): Payer: Self-pay | Admitting: Nurse Practitioner

## 2017-08-21 VITALS — BP 117/69 | HR 75 | Wt 147.0 lb

## 2017-08-21 DIAGNOSIS — Z113 Encounter for screening for infections with a predominantly sexual mode of transmission: Secondary | ICD-10-CM

## 2017-08-21 DIAGNOSIS — Z3483 Encounter for supervision of other normal pregnancy, third trimester: Secondary | ICD-10-CM

## 2017-08-21 DIAGNOSIS — O24410 Gestational diabetes mellitus in pregnancy, diet controlled: Secondary | ICD-10-CM

## 2017-08-21 LAB — POCT URINALYSIS DIP (DEVICE)
BILIRUBIN URINE: NEGATIVE
GLUCOSE, UA: NEGATIVE mg/dL
Hgb urine dipstick: NEGATIVE
KETONES UR: NEGATIVE mg/dL
NITRITE: NEGATIVE
PH: 7 (ref 5.0–8.0)
PROTEIN: NEGATIVE mg/dL
Specific Gravity, Urine: 1.02 (ref 1.005–1.030)
Urobilinogen, UA: 0.2 mg/dL (ref 0.0–1.0)

## 2017-08-21 NOTE — Progress Notes (Signed)
    Subjective:  Elizabeth [name] is a 32 y.o. G2P1001 at 1225w0d being seen today for ongoing prenatal care.  She is currently monitored for the following issues for this high-risk pregnancy and has Gestational diabetes; Supervision of normal pregnancy; History of gestational diabetes; Group B streptococcal bacteriuria; Abnormal glucose in pregnancy, antepartum; and Pyelectasis on their problem list.  Patient reports no bleeding, no leaking and occasional contractions.  Contractions: Irregular. Vag. Bleeding: None.  Movement: Present. Denies leaking of fluid.   The following portions of the patient's history were reviewed and updated as appropriate: allergies, current medications, past family history, past medical history, past social history, past surgical history and problem list. Problem list updated.  Objective:   Vitals:   08/21/17 1322  BP: 117/69  Pulse: 75  Weight: 147 lb (66.7 kg)    Fetal Status: Fetal Heart Rate (bpm): 133 Fundal Height: 38 cm Movement: Present  Presentation: Vertex  General:  Alert, oriented and cooperative. Patient is in no acute distress.  Skin: Skin is warm and dry. No rash noted.   Cardiovascular: Normal heart rate noted  Respiratory: Normal respiratory effort, no problems with respiration noted  Abdomen: Soft, gravid, appropriate for gestational age. Pain/Pressure: Present     Pelvic:  Cervical exam performed Dilation: 1 Effacement (%): Thick Station: -2  Extremities: Normal range of motion.  Edema: None  Mental Status: Normal mood and affect. Normal behavior. Normal judgment and thought content.   Urinalysis: Urine Protein: Negative Urine Glucose: Negative  Assessment and Plan:  Pregnancy: G2P1001 at 3525w0d  1. Encounter for supervision of other normal pregnancy in third trimester Interpreter with her for the entire visit. Did not bring blood sugars with her - reports 34 fasting and 145 not fasting. Advised to bring written list of blood  sugars with her at the next visit.  Reviewed foods she should be eating - she has the info about foods she should eat but unsure if she understands very well.    - GC[facility]Chlam[facility]  2. Diet controlled gestational diabetes mellitus (GDM) in third trimester Unsure about compliance - will add BPP for tomorrow and will evaluate US results at next visit and decide on management from there. - US MFM FETAL BPP WO NON STRESS; Future  Preterm labor symptoms and general obstetric precautions including but not limited to vaginal bleeding, contractions, leaking of fluid and fetal movement were reviewed in detail with the patient. Please refer to After Visit Summary for other counseling recommendations.  Return in about 1 week (around 08/28/2017).  Nolene BernheimERRI Mande Auvil, RN, MSN, NP-BC Nurse Practitioner, [facility], [facility] 08/21/2017 1:48 PM

## 2017-08-21 NOTE — Patient Instructions (Addendum)
Keep appointment tomorrow for ultrasound. Be sure to bring your written list of blood sugars with you to clinic. Braxton Hicks Contractions Contractions of the uterus can occur throughout pregnancy, but they are not always a sign that you are in labor. You may have practice contractions called Braxton Hicks contractions. These false labor contractions are sometimes confused with true labor. What are Deberah PeltonBraxton Hicks contractions? Braxton Hicks contractions are tightening movements that occur in the muscles of the uterus before labor. Unlike true labor contractions, these contractions do not result in opening (dilation) and thinning of the cervix. Toward the end of pregnancy (32-34 weeks), Braxton Hicks contractions can happen more often and may become stronger. These contractions are sometimes difficult to tell apart from true labor because they can be very uncomfortable. You should not feel embarrassed if you go to the hospital with false labor. Sometimes, the only way to tell if you are in true labor is for your health care provider to look for changes in the cervix. The health care provider will do a physical exam and may monitor your contractions. If you are not in true labor, the exam should show that your cervix is not dilating and your water has not broken. If there are other health problems associated with your pregnancy, it is completely safe for you to be sent home with false labor. You may continue to have Braxton Hicks contractions until you go into true labor. How to tell the difference between true labor and false labor True labor  Contractions last 30-70 seconds.  Contractions become very regular.  Discomfort is usually felt in the top of the uterus, and it spreads to the lower abdomen and low back.  Contractions do not go away with walking.  Contractions usually become more intense and increase in frequency.  The cervix dilates and gets thinner. False labor  Contractions are  usually shorter and not as strong as true labor contractions.  Contractions are usually irregular.  Contractions are often felt in the front of the lower abdomen and in the groin.  Contractions may go away when you walk around or change positions while lying down.  Contractions get weaker and are shorter-lasting as time goes on.  The cervix usually does not dilate or become thin. Follow these instructions at home:  Take over-the-counter and prescription medicines only as told by your health care provider.  Keep up with your usual exercises and follow other instructions from your health care provider.  Eat and drink lightly if you think you are going into labor.  If Braxton Hicks contractions are making you uncomfortable: ? Change your position from lying down or resting to walking, or change from walking to resting. ? Sit and rest in a tub of warm water. ? Drink enough fluid to keep your urine pale yellow. Dehydration may cause these contractions. ? Do slow and deep breathing several times an hour.  Keep all follow-up prenatal visits as told by your health care provider. This is important. Contact a health care provider if:  You have a fever.  You have continuous pain in your abdomen. Get help right away if:  Your contractions become stronger, more regular, and closer together.  You have fluid leaking or gushing from your vagina.  You pass blood-tinged mucus (bloody show).  You have bleeding from your vagina.  You have low back pain that you never had before.  You feel your baby's head pushing down and causing pelvic pressure.  Your baby is not  moving inside you as much as it used to. Summary  Contractions that occur before labor are called Braxton Hicks contractions, false labor, or practice contractions.  Braxton Hicks contractions are usually shorter, weaker, farther apart, and less regular than true labor contractions. True labor contractions usually become  progressively stronger and regular and they become more frequent.  Manage discomfort from [facility] contractions by changing position, resting in a warm bath, drinking plenty of water, or practicing deep breathing. This information is not intended to replace advice given to you by your health care provider. Make sure you discuss any questions you have with your health care provider. Document Released: 07/12/2016 Document Revised: 07/12/2016 Document Reviewed: 07/12/2016 Elsevier Interactive Patient Education  2018 [employer].

## 2017-08-22 ENCOUNTER — Encounter (HOSPITAL_COMMUNITY): Payer: Self-pay

## 2017-08-22 ENCOUNTER — Ambulatory Visit (HOSPITAL_COMMUNITY)
Admission: RE | Admit: 2017-08-22 | Discharge: 2017-08-22 | Disposition: A | Discharge status: Home / Self Care | Payer: Self-pay | Source: Ambulatory Visit | Attending: Student | Admitting: Student

## 2017-08-22 DIAGNOSIS — R8271 Bacteriuria: Secondary | ICD-10-CM

## 2017-08-22 DIAGNOSIS — N2889 Other specified disorders of kidney and ureter: Secondary | ICD-10-CM | POA: Insufficient documentation

## 2017-08-22 DIAGNOSIS — O35EXX0 Maternal care for other (suspected) fetal abnormality and damage, fetal genitourinary anomalies, not applicable or unspecified: Secondary | ICD-10-CM

## 2017-08-22 DIAGNOSIS — O24410 Gestational diabetes mellitus in pregnancy, diet controlled: Secondary | ICD-10-CM | POA: Insufficient documentation

## 2017-08-22 DIAGNOSIS — O358XX0 Maternal care for other (suspected) fetal abnormality and damage, not applicable or unspecified: Secondary | ICD-10-CM | POA: Insufficient documentation

## 2017-08-22 DIAGNOSIS — Z3483 Encounter for supervision of other normal pregnancy, third trimester: Secondary | ICD-10-CM

## 2017-08-22 DIAGNOSIS — Z3A36 36 weeks gestation of pregnancy: Secondary | ICD-10-CM | POA: Insufficient documentation

## 2017-08-22 DIAGNOSIS — N1330 Unspecified hydronephrosis: Secondary | ICD-10-CM

## 2017-08-22 LAB — GC/CHLAMYDIA PROBE AMP (~~LOC~~) NOT AT ARMC
CHLAMYDIA, DNA PROBE: NEGATIVE
NEISSERIA GONORRHEA: NEGATIVE

## 2017-08-28 ENCOUNTER — Ambulatory Visit (INDEPENDENT_AMBULATORY_CARE_PROVIDER_SITE_OTHER): Payer: Self-pay | Admitting: Nurse Practitioner

## 2017-08-28 VITALS — BP 113/62 | HR 81 | Wt 148.0 lb

## 2017-08-28 DIAGNOSIS — R8271 Bacteriuria: Secondary | ICD-10-CM

## 2017-08-28 DIAGNOSIS — Z3483 Encounter for supervision of other normal pregnancy, third trimester: Secondary | ICD-10-CM

## 2017-08-28 DIAGNOSIS — N898 Other specified noninflammatory disorders of vagina: Secondary | ICD-10-CM

## 2017-08-28 DIAGNOSIS — Z113 Encounter for screening for infections with a predominantly sexual mode of transmission: Secondary | ICD-10-CM

## 2017-08-28 DIAGNOSIS — O24410 Gestational diabetes mellitus in pregnancy, diet controlled: Secondary | ICD-10-CM

## 2017-08-28 NOTE — Progress Notes (Signed)
    Subjective:  Elizabeth [name] is a 32 y.o. G2P1001 at 2264w0d being seen today for ongoing prenatal care.  She is currently monitored for the following issues for this high-risk pregnancy and has Gestational diabetes; Supervision of normal pregnancy; History of gestational diabetes; Group B streptococcal bacteriuria; Abnormal glucose in pregnancy, antepartum; and Pyelectasis on their problem list.  In person interpreter present for entire visit.    Patient reports more contractions.  Contractions: Irregular. Vag. Bleeding: None.  Movement: Present. Denies leaking of fluid. Also having some bilateral pain with a feeling of pressure - after exam, client informed this is normal pregnancy discomfort.  The following portions of the patient's history were reviewed and updated as appropriate: allergies, current medications, past family history, past medical history, past social history, past surgical history and problem list. Problem list updated.  Objective:   Vitals:   08/28/17 1141  BP: 113/62  Pulse: 81  Weight: 148 lb (67.1 kg)    Fetal Status: Fetal Heart Rate (bpm): 130 Fundal Height: 38 cm Movement: Present  Presentation: Vertex  General:  Alert, oriented and cooperative. Patient is in no acute distress.  Skin: Skin is warm and dry. No rash noted.   Cardiovascular: Normal heart rate noted  Respiratory: Normal respiratory effort, no problems with respiration noted  Abdomen: Soft, gravid, appropriate for gestational age. Pain/Pressure: Present     Pelvic:  Cervical exam performed Dilation: 1 Effacement (%): Thick Station: -2  Extremities: Normal range of motion.  Edema: None  Mental Status: Normal mood and affect. Normal behavior. Normal judgment and thought content.   Urinalysis:      Assessment and Plan:  Pregnancy: G2P1001 at 7764w0d  1. Diet controlled gestational diabetes mellitus (GDM) in third trimester Fasting 99 or under, a few after meals over 120 but not over  130. Ultrasound reviewed - EFW>90th percentile 3376 grams Plan for induction at 40 weeks if has not delivered   2. Group B streptococcal bacteriuria Will treat in labor  3. Encounter for supervision of other normal pregnancy in third trimester Otherwise she is doing well  4. Vaginal irritation Vaginal swab done today  - no discharge seen - no erythema of vaginal mucosa - Cervicovaginal ancillary only  Term labor symptoms and general obstetric precautions including but not limited to vaginal bleeding, contractions, leaking of fluid and fetal movement were reviewed in detail with the patient. Please refer to After Visit Summary for other counseling recommendations.  Return in about 1 week (around 09/04/2017).  Nolene BernheimERRI BURLESON, RN, MSN, NP-BC Nurse Practitioner, [facility], [facility] 08/28/2017 12:03 PM

## 2017-08-28 NOTE — Progress Notes (Signed)
Patient reports yellowish discharge with itching/burning for past 4 days

## 2017-08-29 LAB — CERVICOVAGINAL ANCILLARY ONLY
BACTERIAL VAGINITIS: NEGATIVE
CANDIDA VAGINITIS: NEGATIVE
TRICH (WINDOWPATH): NEGATIVE

## 2017-09-04 ENCOUNTER — Encounter: Payer: Self-pay | Admitting: Medical

## 2017-09-04 ENCOUNTER — Ambulatory Visit (INDEPENDENT_AMBULATORY_CARE_PROVIDER_SITE_OTHER): Payer: Self-pay | Admitting: Medical

## 2017-09-04 VITALS — BP 107/67 | HR 83 | Wt 151.3 lb

## 2017-09-04 DIAGNOSIS — R8271 Bacteriuria: Secondary | ICD-10-CM

## 2017-09-04 DIAGNOSIS — O24410 Gestational diabetes mellitus in pregnancy, diet controlled: Secondary | ICD-10-CM

## 2017-09-04 DIAGNOSIS — N1330 Unspecified hydronephrosis: Secondary | ICD-10-CM

## 2017-09-04 DIAGNOSIS — Z3483 Encounter for supervision of other normal pregnancy, third trimester: Secondary | ICD-10-CM

## 2017-09-04 NOTE — Progress Notes (Signed)
   PRENATAL VISIT NOTE  Subjective:  Ronn MelenaGrecia Rochelle Cruz Luis is a 32 y.o. G2P1001 at 2523w0d being seen today for ongoing prenatal care.  She is currently monitored for the following issues for this high-risk pregnancy and has Gestational diabetes; Supervision of normal pregnancy; History of gestational diabetes; Group B streptococcal bacteriuria; Abnormal glucose in pregnancy, antepartum; and Pyelectasis on their problem list.  Patient reports no complaints.  Contractions: Irregular. Vag. Bleeding: None.  Movement: Present. Denies leaking of fluid.   The following portions of the patient's history were reviewed and updated as appropriate: allergies, current medications, past family history, past medical history, past social history, past surgical history and problem list. Problem list updated.  Objective:   Vitals:   09/04/17 1400  BP: 107/67  Pulse: 83  Weight: 151 lb 4.8 oz (68.6 kg)    Fetal Status: Fetal Heart Rate (bpm): 154   Movement: Present  Presentation: Vertex  General:  Alert, oriented and cooperative. Patient is in no acute distress.  Skin: Skin is warm and dry. No rash noted.   Cardiovascular: Normal heart rate noted  Respiratory: Normal respiratory effort, no problems with respiration noted  Abdomen: Soft, gravid, appropriate for gestational age.  Pain/Pressure: Present     Pelvic: Cervical exam performed Dilation: 1 Effacement (%): Thick Station: -2  Extremities: Normal range of motion.  Edema: Trace  Mental Status: Normal mood and affect. Normal behavior. Normal judgment and thought content.   Assessment and Plan:  Pregnancy: G2P1001 at 9123w0d  1. Encounter for supervision of other normal pregnancy in third trimester  2. Diet controlled gestational diabetes mellitus (GDM) in third trimester - US MFM OB FOLLOW UP; scheduled for growth 7/2 - Well- controlled fasting below 90s and PP mostly < 120, all less than 135 - IOL scheduled at 40 weeks   3. Pyelectasis -  Saw [facility] - recommends FU at 1-2 weeks PP   4. Group B streptococcal bacteriuria - Treat in labor   Term labor symptoms and general obstetric precautions including but not limited to vaginal bleeding, contractions, leaking of fluid and fetal movement were reviewed in detail with the patient. Please refer to After Visit Summary for other counseling recommendations.  [facility] (around 09[facility]11/2017[facility]  Future Appointments  Date Time Provider Department Center  09/10/2017  1:30 PM [facility] US 1 [facility]-MFCUS M[facility]-US  09/18/2017  6:30 AM [facility]-BSS[facility]ED ROOM [facility]SS[facility]ED None    Vonzella NippleJulie , PA-C

## 2017-09-04 NOTE — Patient Instructions (Signed)
Research childbirth classes and hospital preregistration at [email or web address]  Fetal Movement Counts Patient Name: ________________________________________________ Patient Due Date: ____________________ What is a fetal movement count? A fetal movement count is the number of times that you feel your baby move during a certain amount of time. This may also be called a fetal kick count. A fetal movement count is recommended for every pregnant woman. You may be asked to start counting fetal movements as early as week 28 of your pregnancy. Pay attention to when your baby is most active. You may notice your baby's sleep and wake cycles. You may also notice things that make your baby move more. You should do a fetal movement count:  When your baby is normally most active.  At the same time each day.  A good time to count movements is while you are resting, after having something to eat and drink. How do I count fetal movements? 1. Find a quiet, comfortable area. Sit, or lie down on your side. 2. Write down the date, the start time and stop time, and the number of movements that you felt between those two times. Take this information with you to your health care visits. 3. For 2 hours, count kicks, flutters, swishes, rolls, and jabs. You should feel at least 10 movements during 2 hours. 4. You may stop counting after you have felt 10 movements. 5. If you do not feel 10 movements in 2 hours, have something to eat and drink. Then, keep resting and counting for 1 hour. If you feel at least 4 movements during that hour, you may stop counting. Contact a health care provider if:  You feel fewer than 4 movements in 2 hours.  Your baby is not moving like he or she usually does. Date: ____________ Start time: ____________ Stop time: ____________ Movements: ____________ Date: ____________ Start time: ____________ Stop time: ____________ Movements: ____________ Date: ____________ Start time: ____________  Stop time: ____________ Movements: ____________ Date: ____________ Start time: ____________ Stop time: ____________ Movements: ____________ Date: ____________ Start time: ____________ Stop time: ____________ Movements: ____________ Date: ____________ Start time: ____________ Stop time: ____________ Movements: ____________ Date: ____________ Start time: ____________ Stop time: ____________ Movements: ____________ Date: ____________ Start time: ____________ Stop time: ____________ Movements: ____________ Date: ____________ Start time: ____________ Stop time: ____________ Movements: ____________ This information is not intended to replace advice given to you by your health care provider. Make sure you discuss any questions you have with your health care provider. Document Released: 03/28/2006 Document Revised: 10/26/2015 Document Reviewed: 04/07/2015 Elsevier Interactive Patient Education  2018 [employer]  Braxton Hicks Contractions Contractions of the uterus can occur throughout pregnancy, but they are not always a sign that you are in labor. You may have practice contractions called Braxton Hicks contractions. These false labor contractions are sometimes confused with true labor. What are Braxton Hicks contractions? Braxton Hicks contractions are tightening movements that occur in the muscles of the uterus before labor. Unlike true labor contractions, these contractions do not result in opening (dilation) and thinning of the cervix. Toward the end of pregnancy (32-34 weeks), Braxton Hicks contractions can happen more often and may become stronger. These contractions are sometimes difficult to tell apart from true labor because they can be very uncomfortable. You should not feel embarrassed if you go to the hospital with false labor. Sometimes, the only way to tell if you are in true labor is for your health care provider to look for changes in the cervix. The health care provider will   do a physical  exam and may monitor your contractions. If you are not in true labor, the exam should show that your cervix is not dilating and your water has not broken. If there are other health problems associated with your pregnancy, it is completely safe for you to be sent home with false labor. You may continue to have Braxton Hicks contractions until you go into true labor. How to tell the difference between true labor and false labor True labor  Contractions last 30-70 seconds.  Contractions become very regular.  Discomfort is usually felt in the top of the uterus, and it spreads to the lower abdomen and low back.  Contractions do not go away with walking.  Contractions usually become more intense and increase in frequency.  The cervix dilates and gets thinner. False labor  Contractions are usually shorter and not as strong as true labor contractions.  Contractions are usually irregular.  Contractions are often felt in the front of the lower abdomen and in the groin.  Contractions may go away when you walk around or change positions while lying down.  Contractions get weaker and are shorter-lasting as time goes on.  The cervix usually does not dilate or become thin. Follow these instructions at home:  Take over-the-counter and prescription medicines only as told by your health care provider.  Keep up with your usual exercises and follow other instructions from your health care provider.  Eat and drink lightly if you think you are going into labor.  If Braxton Hicks contractions are making you uncomfortable: ? Change your position from lying down or resting to walking, or change from walking to resting. ? Sit and rest in a tub of warm water. ? Drink enough fluid to keep your urine pale yellow. Dehydration may cause these contractions. ? Do slow and deep breathing several times an hour.  Keep all follow-up prenatal visits as told by your health care provider. This is  important. Contact a health care provider if:  You have a fever.  You have continuous pain in your abdomen. Get help right away if:  Your contractions become stronger, more regular, and closer together.  You have fluid leaking or gushing from your vagina.  You pass blood-tinged mucus (bloody show).  You have bleeding from your vagina.  You have low back pain that you never had before.  You feel your baby's head pushing down and causing pelvic pressure.  Your baby is not moving inside you as much as it used to. Summary  Contractions that occur before labor are called Braxton Hicks contractions, false labor, or practice contractions.  Braxton Hicks contractions are usually shorter, weaker, farther apart, and less regular than true labor contractions. True labor contractions usually become progressively stronger and regular and they become more frequent.  Manage discomfort from Braxton Hicks contractions by changing position, resting in a warm bath, drinking plenty of water, or practicing deep breathing. This information is not intended to replace advice given to you by your health care provider. Make sure you discuss any questions you have with your health care provider. Document Released: 07/12/2016 Document Revised: 07/12/2016 Document Reviewed: 07/12/2016 Elsevier Interactive Patient Education  2018 [employer]vier Inc.    

## 2017-09-06 ENCOUNTER — Encounter (HOSPITAL_COMMUNITY): Payer: Self-pay | Admitting: *Deleted

## 2017-09-06 ENCOUNTER — Inpatient Hospital Stay (HOSPITAL_COMMUNITY)
Admission: AD | Admit: 2017-09-06 | Discharge: 2017-09-08 | DRG: 807 | Disposition: A | Discharge status: Home / Self Care | Payer: Medicaid Other | Attending: Obstetrics and Gynecology | Admitting: Obstetrics and Gynecology

## 2017-09-06 DIAGNOSIS — Z3483 Encounter for supervision of other normal pregnancy, third trimester: Secondary | ICD-10-CM

## 2017-09-06 DIAGNOSIS — O24420 Gestational diabetes mellitus in childbirth, diet controlled: Secondary | ICD-10-CM | POA: Diagnosis present

## 2017-09-06 DIAGNOSIS — O358XX0 Maternal care for other (suspected) fetal abnormality and damage, not applicable or unspecified: Secondary | ICD-10-CM | POA: Diagnosis present

## 2017-09-06 DIAGNOSIS — N1330 Unspecified hydronephrosis: Secondary | ICD-10-CM

## 2017-09-06 DIAGNOSIS — O99824 Streptococcus B carrier state complicating childbirth: Secondary | ICD-10-CM | POA: Diagnosis present

## 2017-09-06 DIAGNOSIS — Z3A38 38 weeks gestation of pregnancy: Secondary | ICD-10-CM | POA: Diagnosis not present

## 2017-09-06 DIAGNOSIS — R8271 Bacteriuria: Secondary | ICD-10-CM

## 2017-09-06 LAB — CBC
HCT: 42.4 % (ref 36.0–46.0)
Hemoglobin: 14.1 g/dL (ref 12.0–15.0)
MCH: 28.8 pg (ref 26.0–34.0)
MCHC: 33.3 g/dL (ref 30.0–36.0)
MCV: 86.7 fL (ref 78.0–100.0)
PLATELETS: 201 10*3/uL (ref 150–400)
RBC: 4.89 MIL/uL (ref 3.87–5.11)
RDW: 14.4 % (ref 11.5–15.5)
WBC: 9.9 10*3/uL (ref 4.0–10.5)

## 2017-09-06 LAB — TYPE AND SCREEN
ABO/RH(D): O POS
ANTIBODY SCREEN: NEGATIVE

## 2017-09-06 MED ORDER — ZOLPIDEM TARTRATE 5 MG PO TABS: 5.0000 mg | ORAL_TABLET | Freq: Every evening | ORAL | Status: DC | PRN

## 2017-09-06 MED ORDER — MISOPROSTOL 50MCG HALF TABLET: 500.0000 ug | ORAL_TABLET | Freq: Once | ORAL | Status: DC

## 2017-09-06 MED ORDER — LIDOCAINE HCL (PF) 1 % IJ SOLN
30.0000 mL | INTRAMUSCULAR | Status: AC | PRN
  Administered 2017-09-06: 30 mL via SUBCUTANEOUS
  Filled 2017-09-06: qty 30

## 2017-09-06 MED ORDER — DIBUCAINE 1 % RE OINT: 1.0000 "application " | TOPICAL_OINTMENT | RECTAL | Status: DC | PRN

## 2017-09-06 MED ORDER — MISOPROSTOL 200 MCG PO TABS
ORAL_TABLET | ORAL | Status: AC
  Filled 2017-09-06: qty 3

## 2017-09-06 MED ORDER — PRENATAL MULTIVITAMIN CH
1.0000 | ORAL_TABLET | Freq: Every day | ORAL | Status: DC
  Administered 2017-09-07 – 2017-09-08 (×2): 1 via ORAL
  Filled 2017-09-06 (×2): qty 1

## 2017-09-06 MED ORDER — ONDANSETRON HCL 4 MG/2ML IJ SOLN: 4.0000 mg | INTRAMUSCULAR | Status: DC | PRN

## 2017-09-06 MED ORDER — SENNOSIDES-DOCUSATE SODIUM 8.6-50 MG PO TABS
2.0000 | ORAL_TABLET | ORAL | Status: DC
Start: 2017-09-07 — End: 2017-09-08
  Administered 2017-09-06 – 2017-09-07 (×2): 2 via ORAL
  Filled 2017-09-06 (×2): qty 2

## 2017-09-06 MED ORDER — FLEET ENEMA 7-19 GM/118ML RE ENEM: 1.0000 | ENEMA | RECTAL | Status: DC | PRN

## 2017-09-06 MED ORDER — SIMETHICONE 80 MG PO CHEW
80.0000 mg | CHEWABLE_TABLET | ORAL | Status: DC | PRN
Start: 2017-09-06 — End: 2017-09-08

## 2017-09-06 MED ORDER — WITCH HAZEL-GLYCERIN EX PADS: 1.0000 "application " | MEDICATED_PAD | CUTANEOUS | Status: DC | PRN

## 2017-09-06 MED ORDER — OXYTOCIN 40 UNITS IN LACTATED RINGERS INFUSION - SIMPLE MED
2.5000 [IU]/h | INTRAVENOUS | Status: DC
  Filled 2017-09-06: qty 1000

## 2017-09-06 MED ORDER — OXYCODONE-ACETAMINOPHEN 5-325 MG PO TABS: 1.0000 | ORAL_TABLET | ORAL | Status: DC | PRN

## 2017-09-06 MED ORDER — FENTANYL CITRATE (PF) 100 MCG/2ML IJ SOLN
100.0000 ug | INTRAMUSCULAR | Status: DC | PRN
  Administered 2017-09-06 (×2): 100 ug via INTRAVENOUS
  Filled 2017-09-06 (×2): qty 2

## 2017-09-06 MED ORDER — FENTANYL CITRATE (PF) 100 MCG/2ML IJ SOLN: 100.0000 ug | INTRAMUSCULAR | Status: DC | PRN

## 2017-09-06 MED ORDER — OXYCODONE-ACETAMINOPHEN 5-325 MG PO TABS: 2.0000 | ORAL_TABLET | ORAL | Status: DC | PRN

## 2017-09-06 MED ORDER — ACETAMINOPHEN 325 MG PO TABS: 650.0000 mg | ORAL_TABLET | ORAL | Status: DC | PRN

## 2017-09-06 MED ORDER — ACETAMINOPHEN 325 MG PO TABS
650.0000 mg | ORAL_TABLET | ORAL | Status: DC | PRN
  Administered 2017-09-06 – 2017-09-08 (×3): 650 mg via ORAL
  Filled 2017-09-06 (×3): qty 2

## 2017-09-06 MED ORDER — MISOPROSTOL 200 MCG PO TABS
600.0000 ug | ORAL_TABLET | Freq: Once | ORAL | Status: AC
  Administered 2017-09-06: 600 ug via BUCCAL

## 2017-09-06 MED ORDER — MISOPROSTOL 200 MCG PO TABS
ORAL_TABLET | ORAL | Status: AC
  Filled 2017-09-06: qty 1

## 2017-09-06 MED ORDER — DIPHENHYDRAMINE HCL 25 MG PO CAPS: 25.0000 mg | ORAL_CAPSULE | Freq: Four times a day (QID) | ORAL | Status: DC | PRN

## 2017-09-06 MED ORDER — OXYTOCIN BOLUS FROM INFUSION
500.0000 mL | Freq: Once | INTRAVENOUS | Status: AC
  Administered 2017-09-06: 500 mL via INTRAVENOUS

## 2017-09-06 MED ORDER — LACTATED RINGERS IV SOLN: 500.0000 mL | INTRAVENOUS | Status: DC | PRN

## 2017-09-06 MED ORDER — SOD CITRATE-CITRIC ACID 500-334 MG/5ML PO SOLN: 30.0000 mL | ORAL | Status: DC | PRN

## 2017-09-06 MED ORDER — SODIUM CHLORIDE 0.9 % IV SOLN
2.0000 g | Freq: Once | INTRAVENOUS | Status: AC
  Administered 2017-09-06: 2 g via INTRAVENOUS
  Filled 2017-09-06: qty 2

## 2017-09-06 MED ORDER — BENZOCAINE-MENTHOL 20-0.5 % EX AERO
1.0000 "application " | INHALATION_SPRAY | CUTANEOUS | Status: DC | PRN
  Administered 2017-09-06: 1 via TOPICAL
  Filled 2017-09-06: qty 56

## 2017-09-06 MED ORDER — IBUPROFEN 600 MG PO TABS
600.0000 mg | ORAL_TABLET | Freq: Four times a day (QID) | ORAL | Status: DC
  Administered 2017-09-06 – 2017-09-08 (×8): 600 mg via ORAL
  Filled 2017-09-06 (×8): qty 1

## 2017-09-06 MED ORDER — COCONUT OIL OIL: 1.0000 "application " | TOPICAL_OIL | Status: DC | PRN

## 2017-09-06 MED ORDER — TETANUS-DIPHTH-ACELL PERTUSSIS 5-2.5-18.5 LF-MCG/0.5 IM SUSP: 0.5000 mL | Freq: Once | INTRAMUSCULAR | Status: DC

## 2017-09-06 MED ORDER — LACTATED RINGERS IV SOLN
INTRAVENOUS | Status: DC
  Administered 2017-09-06: 12:00:00 via INTRAVENOUS

## 2017-09-06 MED ORDER — ONDANSETRON HCL 4 MG PO TABS: 4.0000 mg | ORAL_TABLET | ORAL | Status: DC | PRN

## 2017-09-06 MED ORDER — ONDANSETRON HCL 4 MG/2ML IJ SOLN: 4.0000 mg | Freq: Four times a day (QID) | INTRAMUSCULAR | Status: DC | PRN

## 2017-09-06 NOTE — Anesthesia Pain Management Evaluation Note (Signed)
  CRNA Pain Management Visit Note  Patient: Ronn MelenaGrecia Rochelle Cruz Luis, 32 y.o., female  "Hello [facility]. We have an anesthesia team available at all times to provide care throughout the hospital, including epidural management and anesthesia for C-section. I don't know your plan for the delivery whether it a natural birth, water birth, IV sedation, nitrous supplementation, doula or epidural, but we want to meet your pain goals."   1.Was your pain managed to your expectations on prior hospitalizations?   Yes   2.What is your expectation for pain management during this hospitalization?     Epidural  3.How can we help you reach that goal?   Record the patient's initial score and the patient's pain goal.   Pain: 8  Pain Goal: 10 [facility] wants you to be able to say your pain was always managed very well.  Laban EmperorMalinova,Jerren Flinchbaugh Hristova 09/06/2017

## 2017-09-06 NOTE — H&P (Addendum)
Elizabeth [name] is a 32 y.o. female presenting for spontaneous labor. OB History    Gravida  2   Para  1   Term  1   Preterm  0   AB      Living  1     SAB      TAB      Ectopic      Multiple  0   Live Births  1          Past Medical History:  Diagnosis Date  . Gestational diabetes 12/09/2014   diet controlled   Past Surgical History:  Procedure Laterality Date  . HAND SURGERY     Family History: family history includes Diabetes in her father; Hyperlipidemia in her mother; Hypertension in her mother. Social History:  reports that she has never smoked. She has never used smokeless tobacco. She reports that she drank alcohol. She reports that she does not use drugs. [facility]atal Labs  Dating LMP Blood type: O/Positive/-- (11/26 1621)   Genetic Screen  Quad: Declined     Antibody:Negative (11/26 1621)  Anatomic US  Bilateral Pyelectasis, needs ped urology consult.   Rubella: 9.79 (11/26 1621)  GTT Early: 158     Third trimester: failed 3 hour GTT RPR: Non Reactive (04/26 0822)   Flu vaccine 03/06/17 HBsAg: Negative (11/26 1621)   TDaP vaccine 07/24/17                            HIV: Non Reactive (11/26 1621)  Baby Food Breast                                              GBS:  POSITIVE in urine  Contraception  IUD Pap: 11/18 normal  Circumcision  no   [facility]  CF:  Support Person Onalee HuaDavid (FOB)  SMA  Prenatal Classes  Hgb electrophoresis:      Maternal Diabetes: Yes:  Diabetes Type:  Diet controlled Genetic Screening: Declined Maternal Ultrasounds/Referrals: Abnormal:  Findings:   Fetal renal pyelectasis Fetal Ultrasounds or other Referrals:  Other:  [facility] recommend FU at 901-772 weeks of age Maternal Substance Abuse:  No Significant Maternal Medications:  None Significant Maternal Lab Results:  None Other Comments:  GBS positive   Review of Systems  All other systems reviewed and are  negative.  Maternal Medical History:  Reason for admission: Contractions.   Contractions: Onset was 3-5 hours ago.   Frequency: regular.   Perceived severity is strong.    Fetal activity: Perceived fetal activity is normal.    Prenatal Complications - Diabetes: gestational. Diabetes is managed by diet.        Blood pressure 118/81, pulse 88, temperature 97.7 F (36.5 C), temperature source Oral, resp. rate 16, height 5' (1.524 m), weight 155 lb (70.3 kg), last menstrual period 11/24/2016, currently breastfeeding. Maternal Exam:  Uterine Assessment: Contraction strength is firm.  Contraction frequency is regular.   Abdomen: Patient reports no abdominal tenderness. Fetal presentation: vertex  Introitus: Normal vulva. Normal vagina.  Pelvis: adequate for delivery.   Cervix: Cervix evaluated by digital exam.     Fetal Exam Fetal Monitor Review: Mode: ultrasound.   Baseline rate: 135.  Variability: moderate (6-25 bpm).   Pattern: accelerations present and  no decelerations.    Fetal State Assessment: Category I - tracings are normal.     Physical Exam  Nursing note and vitals reviewed. Constitutional: She is oriented to person, place, and time. She appears well-developed and well-nourished. No distress.  HENT:  Head: Normocephalic.  Cardiovascular: Normal rate.  Respiratory: Effort normal.  GI: Soft. There is no tenderness. There is no rebound.  Neurological: She is alert and oriented to person, place, and time.  Skin: Skin is warm and dry.  Psychiatric: She has a normal mood and affect.    Prenatal labs: ABO, Rh: O/Positive/-- (11/26 1621) Antibody: Negative (11/26 1621) Rubella: 9.79 (11/26 1621) RPR: Non Reactive (04/26 6962)  HBsAg: Negative (11/26 1621)  HIV: Non Reactive (04/26 9528)  GBS:   POSITIVE   Assessment/Plan: 32 y.o. G2P1001 at [redacted]w[redacted]d  Active labor Reassuring M-F Status Admit to labor and delivery Ampicillin for GBS Pain control as  needed Anticipate NSVD   Thressa Sheller 09/06/2017, 12:00 PM   Addendum: Last [place] on 08/22/17 Impression  SIUP at 36+1 weeks  Cephalic presentation  UTD A2-3 bilaterally; dilated renal pelves and calyces;  parenchyma appeared normal; no ureters identified  All other interval fetal anatomy was seen and appeared  normal; anatomic survey complete  Normal amniotic fluid volume  EFW > 90th %tile; AC > 97th %tile; 3376 grams; 7+7 ---------------------------------------------------------------------- Recommendations  Routine prenatal care and delivery  Postnatal evaluation of newborn's kidneys; consider  prophylactic antibiotics; Peds Urology consult in Care  Everywhere  Given 1/2 pound fetal weight gain per week, EFW approximately 8 pounds 7 ounces.

## 2017-09-07 LAB — RPR: RPR Ser Ql: NONREACTIVE

## 2017-09-07 NOTE — Plan of Care (Signed)
  Problem: Activity: Goal: Ability to tolerate increased activity will improve Outcome: Completed/Met  Pt ambulating in the room without difficulty.  Encouraged pt to ambulate around the unit.  Discussed the importance of gradually increasing ambulation.  Pt verbalized understanding.   Problem: Education: Goal: Knowledge of condition will improve Outcome: Progressing  Reviewed the importance of emptying her bladder frequently.  Explained that vaginal bleeding was increasing d/t displacement of the uterus from a full bladder.  Pt encouraged to attempt to void every 2-3 hours.  Pt verbalized understanding of all teaching.

## 2017-09-07 NOTE — Progress Notes (Signed)
Post Partum Day 1 Subjective: no complaints, up ad lib, voiding and tolerating PO  Objective: Blood pressure 108/67, pulse 84, temperature 98.2 F (36.8 C), temperature source Oral, resp. rate 16, height 5' (1.524 m), weight 155 lb (70.3 kg), last menstrual period 11/24/2016, SpO2 99 %, unknown if currently breastfeeding.  Physical Exam:  General: alert, cooperative and no distress Lochia: appropriate Uterine Fundus: firm Incision: n/a DVT Evaluation: No evidence of DVT seen on physical exam.  Recent Labs    09/06/17 1205  HGB 14.1  HCT 42.4    Assessment/Plan: Plan for discharge tomorrow and Breastfeeding    LOS: 1 day   Sharen CounterLisa Leftwich-Kirby 09/07/2017, 6:19 PM

## 2017-09-07 NOTE — Lactation Note (Signed)
This note was copied from a baby's chart. Lactation Consultation Note  Patient Name: Elizabeth [name] UJWJX'BToday's Date: 09/07/2017 Reason for consult: Initial assessment;Early term 37-38.6wks;Infant weight loss  21 hours old early term female who is being exclusively BF by his mother, she's a P2 and experienced BF. She was able to BF her first child for 5 1/2 months without any difficulties; she already knows how to hand express. Mom doesn't have a pump at home, [facility] and [facility]. Pump instructions, cleaning and storage was reviewed.  Mom just finished nursing baby when entering the room, she still had him at the breast STS, baby started cueing soon after and mom put him back to the same breast she tried on last time (left one) in cradle position and he was able to latch after a few attempts, he was still a little sleepy but was able to sustain the latch for at least 15', noted wide open mouth with flanged lips and a few audible swallows. Baby still nursing when exiting the room.   Encouraged mom to feed baby STS 8-12 times/24 hours or sooner if feeding cues are present. If baby is not cueing in a 3 hour period, mom will wake him up to feed. BF brochure (SP), BF resources and feeding diary (SP) were also reviewed, mom is aware of LC services and will call PRN.  Maternal Data Formula Feeding for Exclusion: No Has patient been taught Hand Expression?: Yes Does the patient have breastfeeding experience prior to this delivery?: Yes  Feeding Feeding Type: Breast Fed Length of feed: 15 min(baby still nursing when exiting the room)  LATCH Score Latch: Repeated attempts needed to sustain latch, nipple held in mouth throughout feeding, stimulation needed to elicit sucking reflex.  Audible Swallowing: A few with stimulation  Type of Nipple: Everted at rest and after stimulation  Comfort (Breast/Nipple): Soft / non-tender  Hold (Positioning): No assistance  needed to correctly position infant at breast.(minimal assistance needed)  LATCH Score: 8  Interventions Interventions: Breast feeding basics reviewed;Assisted with latch;Skin to skin;Breast massage;Breast compression;Adjust position;Support pillows;Hand pump  Lactation Tools Discussed/Used Tools: Pump Breast pump type: Manual WIC Program: No Pump Review: Setup, frequency, and cleaning;Milk Storage Initiated by:: MPeck Date initiated:: 09/07/17   Consult Status Consult Status: Follow-up Date: 09/08/17 Follow-up type: In-patient    Richardine Peppers Venetia ConstableS Kue Fox 09/07/2017, 1:07 PM

## 2017-09-08 DIAGNOSIS — Z3A38 38 weeks gestation of pregnancy: Secondary | ICD-10-CM

## 2017-09-08 MED ORDER — IBUPROFEN 600 MG PO TABS: 600.0000 mg | ORAL_TABLET | Freq: Four times a day (QID) | ORAL | 0 refills | Status: AC

## 2017-09-08 MED ORDER — PRENATAL MULTIVITAMIN CH: 1.0000 | ORAL_TABLET | Freq: Every day | ORAL | 11 refills | Status: AC

## 2017-09-08 NOTE — Plan of Care (Signed)
  Problem: Education: Goal: Knowledge of condition will improve Outcome: Progressing   Problem: Nutrition: Goal: Adequate nutrition will be maintained Outcome: Progressing

## 2017-09-08 NOTE — Lactation Note (Signed)
This note was copied from a baby's chart. Lactation Consultation Note Baby 3539 hrs old. Baby has 8% weight loss. Baby had adequate output. Cluster feeding. Mom has everted nipples. Mom states getting sore. Noted when baby came off breast nipple had lipstick appearance. Baby aggressive at breast. Baby had high narrow palate, thick labial frenulum, probable posterior tight frenulum. LC feels as if baby may not be transferring enough from breast.  LC assessed breast when first latched. Then after BF, noted some softening. Re-latched, breast massaged, noted increased softening. Encouraged mom to massage breast during feeding at intervals. Mom has hand pump. Breast are filling. Hand expression and hand pump demonstrated. Collected 4 ml colostrum. Spoon fed baby. Discussed w/mom may need to BF then pump and give baby BM as supplement.   D/T high palate LC wonders if NS would help in transfer BM to baby. Will discuss w/on coming LC and that LC will f/u. Mom is experienced BF.   Patient Name: Elizabeth Ronn MelenaGrecia Rochelle Cruz [name]'UToday's Date: 09/08/2017 Reason for consult: Follow-up assessment;Infant weight loss;Early term 37-38.6wks   Maternal Data    Feeding Feeding Type: Breast Fed Length of feed: 30 min  LATCH Score Latch: Grasps breast easily, tongue down, lips flanged, rhythmical sucking.  Audible Swallowing: Spontaneous and intermittent  Type of Nipple: Everted at rest and after stimulation  Comfort (Breast/Nipple): Filling, red/small blisters or bruises, mild/mod discomfort  Hold (Positioning): Assistance needed to correctly position infant at breast and maintain latch.(corrected flange)  LATCH Score: 8  Interventions Interventions: Breast feeding basics reviewed;Support pillows;Assisted with latch;Position options;Breast compression;Hand express;Expressed milk;Breast massage  Lactation Tools Discussed/Used Tools: Pump Breast pump type: Manual   Consult Status Consult Status:  Follow-up Date: 09/08/17 Follow-up type: In-patient    Charyl DancerCARVER, Mousa Prout G 09/08/2017, 6:41 AM

## 2017-09-08 NOTE — Discharge Summary (Signed)
OB Discharge Summary  Patient Name: Elizabeth [name] DOB: 08/21/1985 MRN: [id number]  Date of admission: 09/06/2017 Delivering MD: Marcille Buffy D   Date of discharge: 09/08/2017  Admitting diagnosis: 37WKS, CTX,BLEEDING Intrauterine pregnancy: [redacted]w[redacted]d    Secondary diagnosis:Active Problems:   Normal labor  Additional problems:none     Discharge diagnosis: Term Pregnancy Delivered                                                                     Post partum procedures:n/a  Augmentation: n/a  Complications: None  Hospital course:  Onset of Labor With Vaginal Delivery     32y.o. yo G2P2002 at 362w2das admitted in Active Labor on 09/06/2017. Patient had an uncomplicated labor course as follows:  Membrane Rupture Time/Date: 2:54 PM ,09/06/2017   Intrapartum Procedures: Episiotomy:                                           Lacerations:  2nd degree [3];Perineal [11]  Patient had a delivery of a Viable infant. 09/06/2017  Information for the patient's newborn:  CrHarumi YaminBoGrayland0[[id number]]Delivery Method: Vaginal, Spontaneous(Filed from Delivery Summary)    Pateint had an uncomplicated postpartum course.  She is ambulating, tolerating a regular diet, passing flatus, and urinating well. Patient is discharged home in stable condition on 09/08/17.   Physical exam  Vitals:   09/07/17 0500 09/07/17 1352 09/07/17 2136 09/08/17 0537  BP: 105/66 108/67 114/82 111/65  Pulse: 72 84 77 60  Resp: _0 Temp: 97.9 F (36.6 C) 98.2 F (36.8 C) 98.3 F (36.8 C) 98.5 F (36.9 C)  TempSrc: Oral Oral Oral Oral  SpO2:  99% 100%   Weight:      Height:       General: alert, cooperative and no distress Lochia: appropriate Uterine Fundus: firm Incision: N/A DVT Evaluation: No evidence of DVT seen on physical exam. Labs: Lab Results  Component Value Date   WBC 9.9 09/06/2017   HGB 14.1 09/06/2017   HCT 42.4 09/06/2017   MCV 86.7 09/06/2017   PLT 201 09/06/2017   CMP Latest Ref Rng & Units 05/24/2015  Glucose 65 - 99 mg/dL 109(H)  BUN 6 - 20 mg/dL 14  Creatinine 0.44 - 1.00 mg/dL 0.73  Sodium 135 - 145 mmol/L 141  Potassium 3.5 - 5.1 mmol/L 3.7  Chloride 101 - 111 mmol/L 105  CO2 22 - 32 mmol/L 22  Calcium 8.9 - 10.3 mg/dL 9.8  Total Protein 6.5 - 8.1 g/dL 7.5  Total Bilirubin 0.3 - 1.2 mg/dL 0.6  Alkaline Phos 38 - 126 U/L 88  AST 15 - 41 U/L 35  ALT 14 - 54 U/L 28    Discharge instruction: per After Visit Summary and "Baby and Me Booklet".  After Visit Meds:  Allergies as of 09/08/2017   No Known Allergies     Medication List    STOP taking these medications   GNP PRENATAL VITAMINS 28-0.8 MG Tabs     TAKE these medications   ACCU-CHEK FASTCLIX LANCETS Misc 1  Units by Percutaneous route 4 (four) times daily.   ACCU-CHEK GUIDE w/Device Kit 1 Device by Does not apply route 4 (four) times daily.   glucose blood test strip Use as instructed   ibuprofen 600 MG tablet Commonly known as:  ADVIL,MOTRIN Take 1 tablet (600 mg total) by mouth every 6 (six) hours.   prenatal multivitamin Tabs tablet Take 1 tablet by mouth daily at 12 noon.       Diet: carb modified diet  Activity: Advance as tolerated. Pelvic rest for 6 weeks.   Outpatient follow up:6 weeks Follow up Appt: Future Appointments  Date Time Provider [place]  09/10/2017  1:30 PM WH-MFC [place] 1 WH-MFCUS MFC-US  09/13/2017 10:35 AM Woodroe Mode, MD [place] [facility]   Follow up visit: No follow-ups on file.  Postpartum contraception: IUD Mirena  Newborn Data: Live born female  Birth Weight: 8 lb 5.9 oz (3796 g) APGAR: 8, 9  Newborn Delivery   Birth date/time:  09/06/2017 15:08:00 Delivery type:  Vaginal, Spontaneous     Baby Feeding: Breast Disposition:home with mother   09/08/2017 Koren Shiver, CNM

## 2017-09-09 ENCOUNTER — Ambulatory Visit: Payer: Self-pay

## 2017-09-09 NOTE — Lactation Note (Signed)
This note was copied from a baby's chart. Lactation Consultation Note  Patient Name: Boy Ronn MelenaGrecia Rochelle Cruz [name]'UToday's Date: 09/09/2017 Reason for consult: Follow-up assessment;Infant weight loss;Nipple pain/trauma;Early term 8037-38.6wks  Visited with P2 Mom of 2766 hr old baby.  Baby noted to be at 11% weight loss, and bilirubin elevated to 16.7 this am.  Double phototherapy started this am.  Baby crying in crib.  Offered to assist/assess with breast feeding.  Mom stated that baby had already fed for 15 mins.  Recommended she offer baby the 2nd breast.  Mom needing some guidance to establish a deep latch using cross cradle hold.  Baby able to latch deeply, but was sucking minimally and no swallowing identified.  Reassured parents about normal sleepiness with jaundice and weight loss.  Supplementation recommended temporarily to increase calories and help reduce bilirubin levels.  Set up DEBP and assisted with first double pumping.  Mom had resisted using double pump as "hand pump works better".  Benefits of double pumping shared.   Mom expressed 5 ml EBM.  SNS at finger set up for FOB to finger feed baby first feeding.  Formula added to EBM to work towards 30 ml supplement after each breastfeeding.  Plan- 1- Breastfeed baby on cue, or awaken at 3 hrs.  Keep baby STS as much as possible 2- Offer 30 ml EBM+/formula by SNS or paced bottle  3- Double pump on initiation setting after breastfeeding 4- use coconut oil on nipples for soreness. 5- ask for help prn  Lactation to follow up in am or prn.  Maternal Data  Gestational Diabetic  Feeding Feeding Type: Breast Milk Length of feed: 20 min  LATCH Score Latch: Grasps breast easily, tongue down, lips flanged, rhythmical sucking.  Audible Swallowing: A few with stimulation  Type of Nipple: Everted at rest and after stimulation  Comfort (Breast/Nipple): Filling, red/small blisters or bruises, mild/mod discomfort  Hold (Positioning):  Assistance needed to correctly position infant at breast and maintain latch.  LATCH Score: 7  Interventions Interventions: Breast feeding basics reviewed;Assisted with latch;Skin to skin;Breast massage;Hand express;Breast compression;Adjust position;Support pillows;Position options;Expressed milk;Coconut oil;DEBP  Lactation Tools Discussed/Used Tools: Pump;Supplemental Nutrition System Breast pump type: Double-Electric Breast Pump WIC Program: No Pump Review: Setup, frequency, and cleaning;Milk Storage Initiated by:: Erby Pian Grayson Pfefferle RN IBCLC Date initiated:: 09/09/17   Consult Status Consult Status: Follow-up Date: 09/10/17 Follow-up type: In-patient    Judee ClaraSmith, Sharayah Renfrow E 09/09/2017, 9:23 AM

## 2017-09-09 NOTE — Lactation Note (Signed)
This note was copied from a baby's chart. Lactation Consultation Note Baby 59 hrs. Old. Mom has baby in side lying position. Baby sleeping. Mom awake. Mom states BF going well.  Encouraged mom to call for assistance. Tell mom need to see latch.  Patient Name: Elizabeth Ronn MelenaGrecia Rochelle Cruz Luis Today's Date: 09/09/2017 Reason for consult: Infant weight loss   Maternal Data    Feeding Feeding Type: Breast Fed Length of feed: 25 min  LATCH Score                   Interventions    Lactation Tools Discussed/Used     Consult Status Consult Status: Follow-up Date: 09/09/17 Follow-up type: In-patient    Monay Houlton, Diamond NickelLAURA G 09/09/2017, 3:03 AM

## 2017-09-10 ENCOUNTER — Ambulatory Visit (HOSPITAL_COMMUNITY): Admission: RE | Admit: 2017-09-10 | Payer: Medicaid Other | Source: Ambulatory Visit

## 2017-09-10 ENCOUNTER — Ambulatory Visit: Payer: Self-pay

## 2017-09-10 NOTE — Lactation Note (Addendum)
This note was copied from a baby's chart. Lactation Consultation Note  Patient Name: Elizabeth Ronn MelenaGrecia Rochelle Cruz [name]'UToday's Date: 09/10/2017 Reason for consult: Follow-up assessment;Hyperbilirubinemia Bilirubin remains elevated and baby on double phototherapy.  Mom is breastfeeding and supplementing with formula.  Baby is 8790 hours old and mom has full breasts. Weight loss 8%.  She hasn't been pumping.  Instructed to use DEBP every 2-3 hours and supplement with expressed milk.  Mom agreeable.  She states she is comfortable using the pump and denies questions.  Encouraged to call for assist prn.  Maternal Data    Feeding Feeding Type: Breast Fed  LATCH Score Latch: Repeated attempts needed to sustain latch, nipple held in mouth throughout feeding, stimulation needed to elicit sucking reflex.  Audible Swallowing: A few with stimulation  Type of Nipple: Everted at rest and after stimulation  Comfort (Breast/Nipple): Filling, red/small blisters or bruises, mild/mod discomfort  Hold (Positioning): No assistance needed to correctly position infant at breast.  LATCH Score: 7  Interventions    Lactation Tools Discussed/Used     Consult Status Consult Status: Follow-up Date: 09/11/17 Follow-up type: In-patient    Huston FoleyMOULDEN, Anniah Glick S 09/10/2017, 9:58 AM

## 2017-09-13 ENCOUNTER — Encounter: Payer: Self-pay | Admitting: Obstetrics & Gynecology

## 2017-09-18 ENCOUNTER — Inpatient Hospital Stay (HOSPITAL_COMMUNITY): Admission: RE | Admit: 2017-09-18 | Payer: Self-pay | Source: Ambulatory Visit

## 2017-10-08 ENCOUNTER — Ambulatory Visit (INDEPENDENT_AMBULATORY_CARE_PROVIDER_SITE_OTHER): Payer: Self-pay | Admitting: Student

## 2017-10-08 ENCOUNTER — Encounter: Payer: Self-pay | Admitting: Student

## 2017-10-08 MED ORDER — NORETHIN ACE-ETH ESTRAD-FE 1-20 MG-MCG(24) PO TABS: 1.0000 | ORAL_TABLET | Freq: Every day | ORAL | 11 refills | Status: AC

## 2017-10-08 NOTE — Progress Notes (Signed)
Pt is considering the Birth Control pill or IUD.

## 2017-10-08 NOTE — Progress Notes (Signed)
Pt states burning sensation in vagina area when urinating & showering. Pt states last week had pain on left side of pelvic area but pain pills took it away.

## 2017-10-08 NOTE — Patient Instructions (Addendum)
Prueba de tolerancia a la glucosa Glucose Tolerance Test La prueba de tolerancia a la glucosa (PTG) es una de las diversas pruebas que se usan para diagnosticar diabetes mellitus. La PTG es un anlisis de [place] y [place]tambin puede incluir un anlisis de [place]. La PTG verifica la manera en que el cuerpo procesa el azcar (glucosa). Para esta prueba, consumir una bebida que contiene un nivel alto de glucosa. Se controlarn los niveles de glucemia antes de consumir la bebida, y luego 1, 2, 3 y posiblemente 4 horas despus de haberla bebido. El mdico puede recomendarle que se realice la PTG si:  Tienen antecedentes familiares de diabetes.  Tiene mucho sobrepeso (es obeso).  Ha tenido infecciones que reaparecieron.  Ha tenido varios cortes o heridas que no se curaron rpidamente, en especial en las piernas y los pies.  Es mujer y tiene antecedentes de haber parido bebs muy grandes o antecedentes de muerte fetal repetida (beb nacido muerto).  Ha tenido glucosa en la orina o un nivel alto de azcar en la sangre: ? Durante el embarazo. ? Despus de un infarto de miocardio, [place] Herz[place]una ciruga o perodos prolongados de Lexmark Internationalmucho estrs.  La PTG dura entre 3 y 4 horas. No se le permitir comer ni beber nada durante la prueba, excepto la solucin de glucosa. Debe Geneticist, molecularpermanecer en el lugar en que se realiza la prueba para asegurarse de que las Boardmanmuestras de sangre y [place] se tomen puntualmente. Cmo debo prepararme para esta prueba? Coma normalmente durante 3 das antes de la PTG e incluya muchos alimentos ricos en carbohidratos. No coma ni beba nada, excepto agua, durante las ltimas 12horas antes de la prueba. No debe fumar ni hacer ejercicio durante la prueba. Adems, el mdico puede pedirle que deje de tomar ciertos medicamentos antes de la prueba. Qu significan los resultados? Es su responsabilidad retirar el resultado del [place] Springsestudio. Consulte en el laboratorio o en el departamento en el que fue realizado el  estudio cundo y cmo podr Starbucks Corporationobtener los resultados. Comunquese con el mdico si tiene Smith Internationalpreguntas sobre los resultados. Rango de Circuit Cityvalores normales Los rangos para los valores normales pueden variar entre diferentes laboratorios y hospitales. Siempre debe consultar a su mdico despus de realizarse un anlisis u otros estudios para saber si los valores de sus [place]resultados se consideran dentro de los lmites normales. Los niveles normales de glucemia son los siguientes:  Ayuno: menos de 110mg /dl o menos de 1,6XWRU/E6,1mmol/l (unidades SI).  Una hora despus de consumir la bebida con glucosa: menos de 200mg /dl o menos de 45,4UJWJ/X11,1mmol/l.  Dos horas despus de consumir la bebida con glucosa: menos de 140mg /dl o menos de 9,1YNWG/N7,8mmol/l.  Tres horas despus de consumir la bebida con glucosa: de 70 a 115mg /dl o menos de 5,6OZHY/Q6,4mmol/l.  Cuatro horas despus de consumir la bebida con glucosa: de 70 a 115mg /dl o menos de 6,5HQIO/N6,4mmol/l.  El resultado normal del anlisis de [place] es negativo, lo que significa que no hay glucosa en la orina. Algunas sustancias pueden AutoZonealterar los resultados de la PTG. Estos pueden incluir lo siguiente:  Medicamentos para la presin arterial y la insuficiencia cardaca, como betabloqueantes, furosemida y tiacidas.  Antiinflamatorios, como aspirina.  La nicotina.  Algunos medicamentos psiquitricos.  Anticonceptivos orales.  Diurticos o corticoides.  Significado de los Ball Corporationresultados que estn fuera de los rangos de los valores normales Los resultados de la PTG por encima de los valores normales pueden indicar problemas de salud, tales como:  Diabetes mellitus.  Respuesta al estrs agudo.  Sndrome de  [place].  Tumores como feocromocitoma o glucagonoma.  Insuficiencia renal crnica.  Pancreatitis.  Hipertiroidismo.  Infeccin actual.  Hable con el mdico sobre los resultados. El mdico utilizar los resultados para realizar un diagnstico y determinar un plan de tratamiento  adecuado para usted. Hable con el mdico sobre los resultados, las opciones de tratamiento y, si es necesario, la necesidad de realizar ms estudios. Hable con el mdico si tiene alguna pregunta sobre los resultados. Esta informacin no tiene como fin reemplazar el consejo del mdico. Asegrese de hacerle al mdico cualquier pregunta que tenga. Document Released: 12/17/2012 Document Revised: 05/17/2016 Document Reviewed: 07/03/2013 Elsevier Interactive Patient Education  2018 Elsevier Inc.  

## 2017-10-08 NOTE — Progress Notes (Signed)
Subjective:     Elizabeth [name] is a 32 y.o. female who presents for a postpartum visit. She is 5 weeks postpartum following a spontaneous vaginal delivery. I have fully reviewed the prenatal and intrapartum course. The delivery was at 38/2  gestational weeks. Outcome: spontaneous vaginal delivery. Anesthesia: local. Postpartum course has been uneventful Baby's course has been uneventful Baby is feeding by both breast and bottle - [employer] Good Start. Bleeding no bleeding. Bowel function is normal. Bladder function is normal. Patient is not sexually active. Contraception method is none. Postpartum depression screening: negative.  The following portions of the patient's history were reviewed and updated as appropriate: allergies, current medications, past family history, past medical history, past social history, past surgical history and problem list.  Review of Systems Genitourinary:positive for some pain with urination due to her tear; would like it to be examined.    Objective:    BP 99/66   Pulse 70   Ht 5' (1.524 m)   Wt 133 lb 11.2 oz (60.6 kg)   Breastfeeding? Yes   BMI 26.11 kg/m   General:  alert, cooperative and no distress   Breasts:  inspection negative, no nipple discharge or bleeding, no masses or nodularity palpable  Lungs: clear to auscultation bilaterally  Heart:  regular rate and rhythm, S1, S2 normal, no murmur, click, rub or gallop  Abdomen: soft, non-tender; bowel sounds normal; no masses,  no organomegaly   Vulva:  positive for laceration bilaterally. Perineum well approximated but still with some areas of granulation.   Vagina: not evaluated  Cervix:  not examined.   Corpus: not examined  Adnexa:  not evaluated  Rectal Exam: Not performed.        Assessment:    Healthy postpartum exam. Pap smear not done at today's visit. Applied silver nitrate to granulation tissue.  -patient undecided about BC; given RX for Loestrin [facility]  decides she wants [facility].   Plan:    1. Contraception: OCP (estrogen/progesterone) 2. Return to clinic if tear is bothering her; return to clinic for 2 hour GTT.  3. Follow up in: 1 week or as needed.

## 2017-10-14 ENCOUNTER — Other Ambulatory Visit: Payer: Self-pay

## 2019-12-06 ENCOUNTER — Emergency Department (HOSPITAL_BASED_OUTPATIENT_CLINIC_OR_DEPARTMENT_OTHER): Payer: HRSA Program

## 2019-12-06 ENCOUNTER — Other Ambulatory Visit: Payer: Self-pay | Admitting: Infectious Diseases

## 2019-12-06 ENCOUNTER — Ambulatory Visit (HOSPITAL_COMMUNITY)
Admit: 2019-12-06 | Discharge: 2019-12-06 | Disposition: A | Discharge status: Home / Self Care | Payer: HRSA Program | Attending: Pulmonary Disease | Admitting: Pulmonary Disease

## 2019-12-06 ENCOUNTER — Telehealth: Payer: Self-pay | Admitting: Infectious Diseases

## 2019-12-06 ENCOUNTER — Emergency Department (HOSPITAL_BASED_OUTPATIENT_CLINIC_OR_DEPARTMENT_OTHER)
Admission: EM | Admit: 2019-12-06 | Discharge: 2019-12-06 | Disposition: A | Discharge status: Home / Self Care | Payer: HRSA Program | Attending: Emergency Medicine | Admitting: Emergency Medicine

## 2019-12-06 ENCOUNTER — Encounter (HOSPITAL_BASED_OUTPATIENT_CLINIC_OR_DEPARTMENT_OTHER): Payer: Self-pay | Admitting: Emergency Medicine

## 2019-12-06 ENCOUNTER — Other Ambulatory Visit: Payer: Self-pay

## 2019-12-06 DIAGNOSIS — O98513 Other viral diseases complicating pregnancy, third trimester: Secondary | ICD-10-CM | POA: Diagnosis present

## 2019-12-06 DIAGNOSIS — U071 COVID-19: Secondary | ICD-10-CM

## 2019-12-06 DIAGNOSIS — Z3A30 30 weeks gestation of pregnancy: Secondary | ICD-10-CM

## 2019-12-06 DIAGNOSIS — J1282 Pneumonia due to coronavirus disease 2019: Secondary | ICD-10-CM | POA: Insufficient documentation

## 2019-12-06 DIAGNOSIS — Z8632 Personal history of gestational diabetes: Secondary | ICD-10-CM

## 2019-12-06 LAB — CBC WITH DIFFERENTIAL/PLATELET
Abs Immature Granulocytes: 0.06 10*3/uL (ref 0.00–0.07)
Basophils Absolute: 0 10*3/uL (ref 0.0–0.1)
Basophils Relative: 0 %
Eosinophils Absolute: 0 10*3/uL (ref 0.0–0.5)
Eosinophils Relative: 0 %
HCT: 38.2 % (ref 36.0–46.0)
Hemoglobin: 12.7 g/dL (ref 12.0–15.0)
Immature Granulocytes: 1 %
Lymphocytes Relative: 14 %
Lymphs Abs: 0.6 10*3/uL — ABNORMAL LOW (ref 0.7–4.0)
MCH: 28.9 pg (ref 26.0–34.0)
MCHC: 33.2 g/dL (ref 30.0–36.0)
MCV: 87 fL (ref 80.0–100.0)
Monocytes Absolute: 0.2 10*3/uL (ref 0.1–1.0)
Monocytes Relative: 4 %
Neutro Abs: 3.7 10*3/uL (ref 1.7–7.7)
Neutrophils Relative %: 81 %
Platelets: 157 10*3/uL (ref 150–400)
RBC: 4.39 MIL/uL (ref 3.87–5.11)
RDW: 13.7 % (ref 11.5–15.5)
WBC: 4.5 10*3/uL (ref 4.0–10.5)
nRBC: 0 % (ref 0.0–0.2)

## 2019-12-06 LAB — URINALYSIS, ROUTINE W REFLEX MICROSCOPIC
Glucose, UA: NEGATIVE mg/dL
Hgb urine dipstick: NEGATIVE
Ketones, ur: >80 mg/dL — AB
Nitrite: NEGATIVE
Protein, ur: 30 mg/dL — AB
Specific Gravity, Urine: 1.02 (ref 1.005–1.030)
pH: 7 (ref 5.0–8.0)

## 2019-12-06 LAB — COMPREHENSIVE METABOLIC PANEL
ALT: 40 U/L (ref 0–44)
AST: 83 U/L — ABNORMAL HIGH (ref 15–41)
Albumin: 2.8 g/dL — ABNORMAL LOW (ref 3.5–5.0)
Alkaline Phosphatase: 133 U/L — ABNORMAL HIGH (ref 38–126)
Anion gap: 13 (ref 5–15)
BUN: 6 mg/dL (ref 6–20)
CO2: 19 mmol/L — ABNORMAL LOW (ref 22–32)
Calcium: 7.9 mg/dL — ABNORMAL LOW (ref 8.9–10.3)
Chloride: 102 mmol/L (ref 98–111)
Creatinine, Ser: 0.5 mg/dL (ref 0.44–1.00)
GFR calc Af Amer: >60 mL/min (ref >60)
GFR calc non Af Amer: >60 mL/min (ref >60)
Glucose, Bld: 91 mg/dL (ref 70–99)
Potassium: 3.5 mmol/L (ref 3.5–5.1)
Sodium: 134 mmol/L — ABNORMAL LOW (ref 135–145)
Total Bilirubin: 0.7 mg/dL (ref 0.3–1.2)
Total Protein: 6.3 g/dL — ABNORMAL LOW (ref 6.5–8.1)

## 2019-12-06 LAB — TROPONIN I (HIGH SENSITIVITY): Troponin I (High Sensitivity): 5 ng/L (ref <18)

## 2019-12-06 LAB — URINALYSIS, MICROSCOPIC (REFLEX): RBC / HPF: NONE SEEN RBC/hpf (ref 0–5)

## 2019-12-06 MED ORDER — EPINEPHRINE 0.3 MG/0.3ML IJ SOAJ: 0.3000 mg | Freq: Once | INTRAMUSCULAR | Status: DC | PRN

## 2019-12-06 MED ORDER — NITROFURANTOIN MONOHYD MACRO 100 MG PO CAPS: 100.0000 mg | ORAL_CAPSULE | Freq: Two times a day (BID) | ORAL | 0 refills | Status: AC

## 2019-12-06 MED ORDER — SODIUM CHLORIDE 0.9 % IV SOLN: INTRAVENOUS | Status: DC | PRN

## 2019-12-06 MED ORDER — ACETAMINOPHEN 325 MG PO TABS
650.0000 mg | ORAL_TABLET | Freq: Four times a day (QID) | ORAL | Status: DC | PRN
  Administered 2019-12-06: 650 mg via ORAL
  Filled 2019-12-06 (×2): qty 2

## 2019-12-06 MED ORDER — ALBUTEROL SULFATE HFA 108 (90 BASE) MCG/ACT IN AERS: 2.0000 | INHALATION_SPRAY | Freq: Once | RESPIRATORY_TRACT | Status: DC | PRN

## 2019-12-06 MED ORDER — FAMOTIDINE IN NACL 20-0.9 MG/50ML-% IV SOLN: 20.0000 mg | Freq: Once | INTRAVENOUS | Status: DC | PRN

## 2019-12-06 MED ORDER — DIPHENHYDRAMINE HCL 50 MG/ML IJ SOLN: 50.0000 mg | Freq: Once | INTRAMUSCULAR | Status: DC | PRN

## 2019-12-06 MED ORDER — CASIRIVIMAB-IMDEVIMAB 600-600 MG/10ML IJ SOLN
1200.0000 mg | Freq: Once | INTRAMUSCULAR | Status: AC
  Administered 2019-12-06: 1200 mg via INTRAVENOUS
  Filled 2019-12-06: qty 10

## 2019-12-06 MED ORDER — METHYLPREDNISOLONE SODIUM SUCC 125 MG IJ SOLR: 125.0000 mg | Freq: Once | INTRAMUSCULAR | Status: DC | PRN

## 2019-12-06 NOTE — Discharge Instructions (Addendum)
You have pneumonia in both of your lungs as a result of COVID-19.  That is likely what is causing your symptoms of shortness of breath.  Please continue taking Tylenol as needed for fever control.  I have contacted the [facility] infusion center.  They will reach out to determine whether or not you are candidate for infusions.  Please call them at [phone] tomorrow if you do not hear from them.  Please follow-up with your OB/GYN regarding today's encounter.  Please have a low threshold to return to the ED or seek immediate medical attention should you experience any new or worsening symptoms.

## 2019-12-06 NOTE — Progress Notes (Signed)
I connected by phone with Elizabeth [name] on 12/06/2019 at 2:29 PM to discuss the potential use of a new treatment for mild to moderate COVID-19 viral infection in non-hospitalized patients.  This patient is a 34 y.o. female that meets the FDA criteria for Emergency Use Authorization of COVID monoclonal antibody casirivimab/imdevimab or bamlanivimab/eteseviamb.  Has a (+) direct SARS-CoV-2 viral test result  Has mild or moderate COVID-19   Is NOT hospitalized due to COVID-19  Is within 10 days of symptom onset  Has at least one of the high risk factor(s) for progression to severe COVID-19 and/or hospitalization as defined in EUA.  Specific high risk criteria : BMI > 25, Pregnancy, Diabetes and Other high risk medical condition per CDC:  high SVI risk    I have spoken and communicated the following to the patient or parent/caregiver regarding COVID monoclonal antibody treatment:  1. FDA has authorized the emergency use for the treatment of mild to moderate COVID-19 in adults and pediatric patients with positive results of direct SARS-CoV-2 viral testing who are 21 years of age and older weighing at least 40 kg, and who are at high risk for progressing to severe COVID-19 and/or hospitalization.  2. The significant known and potential risks and benefits of COVID monoclonal antibody, and the extent to which such potential risks and benefits are unknown.  3. Information on available alternative treatments and the risks and benefits of those alternatives, including clinical trials.  4. Patients treated with COVID monoclonal antibody should continue to self-isolate and use infection control measures (e.g., wear mask, isolate, social distance, avoid sharing personal items, clean and disinfect "high touch" surfaces, and frequent handwashing) according to CDC guidelines.   5. The patient or parent/caregiver has the option to accept or refuse COVID monoclonal antibody treatment.  After  reviewing this information with the patient, the patient has agreed to receive one of the available covid 19 monoclonal antibodies and will be provided an appropriate fact sheet prior to infusion. Rexene Alberts, NP 12/06/2019 2:29 PM

## 2019-12-06 NOTE — ED Provider Notes (Addendum)
[facility] EMERGENCY DEPARTMENT Provider Note   CSN: [id number] Arrival date & time: 12/06/19  1016     History Chief Complaint  Patient presents with  . COVID    Elizabeth [name] is a 34 y.o. female [id number] approximately [redacted] weeks gestation who is COVID-19 positive who presents to the ED with a 7-day history of cough and SOB.  Patient reports that she tested positive on 12/01/2019.  She states that she has had gradual progression in her shortness of breath and cough symptoms.  She states that her cough is nonproductive, no hemoptysis.  She has had fevers, as high as 104 F.  She also complains of increased urinary frequency concerning for UTI.  Patient notes that when she was in the shower the other day her hands got red and itchy.  She states that she has been taking Tylenol for her symptoms, with relatively good effect.  On my exam, she is afebrile despite reporting high temperatures at home.  She has been in communication with her OB/GYN who informed her to take Tylenol as needed and that she can follow-up with him after she is improved.  She denies any history of clots or clotting disorder, abrupt onset and her shortness of breath symptoms, unilateral extremity swelling or edema, abdominal pain beyond when she coughs, vaginal bleeding/vaginal pain, or other symptoms.  Did not receive the COVID-19 vaccine.  She declined Spanish interpreter services for my examination and speaks English well.  HPI     Past Medical History:  Diagnosis Date  . Gestational diabetes 12/09/2014   diet controlled    Patient Active Problem List   Diagnosis Date Noted  . Normal labor 09/06/2017  . History of gestational diabetes 02/04/2017    Past Surgical History:  Procedure Laterality Date  . HAND SURGERY       OB History    Gravida  2   Para  2   Term  2   Preterm  0   AB      Living  2     SAB      TAB      Ectopic      Multiple  0   Live Births  2             Family History  Problem Relation Age of Onset  . Hypertension Mother   . Hyperlipidemia Mother   . Diabetes Father     Social History   Tobacco Use  . Smoking status: Never Smoker  . Smokeless tobacco: Never Used  Vaping Use  . Vaping Use: Never used  Substance Use Topics  . Alcohol use: Yes    Comment: occasion  . Drug use: No    Home Medications Prior to Admission medications   Medication Sig Start Date End Date Taking? Authorizing Provider  ACCU-CHEK FASTCLIX LANCETS MISC 1 Units by Percutaneous route 4 (four) times daily. 07/08/17   Burleson, Rona Ravens, NP  Blood Glucose Monitoring Suppl (ACCU-CHEK GUIDE) w/Device KIT 1 Device by Does not apply route 4 (four) times daily. 07/08/17   Burleson, Rona Ravens, NP  glucose blood test strip Use as instructed 07/08/17   Burleson, Rona Ravens, NP  ibuprofen (ADVIL,MOTRIN) 600 MG tablet Take 1 tablet (600 mg total) by mouth every 6 (six) hours. 09/08/17   Keitha Butte, CNM  nitrofurantoin, macrocrystal-monohydrate, (MACROBID) 100 MG capsule Take 1 capsule (100 mg total) by mouth 2 (two) times daily. 12/06/19   Krista Blue  L, PA-C  Norethindrone Acetate-Ethinyl Estrad-FE (LOESTRIN 24 FE) 1-20 MG-MCG(24) tablet Take 1 tablet by mouth daily. 10/08/17   Starr Lake, CNM  Prenatal Vit-Fe Fumarate-FA (PRENATAL MULTIVITAMIN) TABS tablet Take 1 tablet by mouth daily at 12 noon. 09/08/17   Keitha Butte, CNM    Allergies    Patient has no known allergies.  Review of Systems   Review of Systems  All other systems reviewed and are negative.   Physical Exam Updated Vital Signs BP 104/71   Pulse 98   Temp 99.2 F (37.3 C) (Oral)   Resp (!) 36   Ht 5' (1.524 m)   Wt 66.2 kg   SpO2 98%   BMI 28.51 kg/m   Physical Exam Vitals and nursing note reviewed. Exam conducted with a chaperone present.  Constitutional:      General: She is not in acute distress.    Appearance: She is ill-appearing.  HENT:     Head:  Normocephalic and atraumatic.  Eyes:     General: No scleral icterus.    Conjunctiva/sclera: Conjunctivae normal.  Cardiovascular:     Rate and Rhythm: Regular rhythm. Tachycardia present.     Pulses: Normal pulses.     Heart sounds: Normal heart sounds.  Pulmonary:     Comments: No significant increased work of breathing.  Mildly tachypneic.  Rales noted in lower lobes bilaterally.  No acute distress.  No accessory muscle use. Abdominal:     Comments: Pregnant.  No focal TTP.  Skin:    General: Skin is dry.     Capillary Refill: Capillary refill takes less than 2 seconds.  Neurological:     Mental Status: She is alert.     GCS: GCS eye subscore is 4. GCS verbal subscore is 5. GCS motor subscore is 6.  Psychiatric:        Mood and Affect: Mood normal.        Behavior: Behavior normal.        Thought Content: Thought content normal.     ED Results / Procedures / Treatments   Labs (all labs ordered are listed, but only abnormal results are displayed) Labs Reviewed  CBC WITH DIFFERENTIAL/PLATELET - Abnormal; Notable for the following components:      Result Value   Lymphs Abs 0.6 (*)    All other components within normal limits  COMPREHENSIVE METABOLIC PANEL - Abnormal; Notable for the following components:   Sodium 134 (*)    CO2 19 (*)    Calcium 7.9 (*)    Total Protein 6.3 (*)    Albumin 2.8 (*)    AST 83 (*)    Alkaline Phosphatase 133 (*)    All other components within normal limits  URINALYSIS, ROUTINE W REFLEX MICROSCOPIC - Abnormal; Notable for the following components:   APPearance HAZY (*)    Bilirubin Urine SMALL (*)    Ketones, ur >80 (*)    Protein, ur 30 (*)    Leukocytes,Ua SMALL (*)    All other components within normal limits  URINALYSIS, MICROSCOPIC (REFLEX) - Abnormal; Notable for the following components:   Bacteria, UA MANY (*)    All other components within normal limits  TROPONIN I (HIGH SENSITIVITY)    EKG EKG  Interpretation  Date/Time:  Sunday December 06 2019 11:05:41 EDT Ventricular Rate:  96 PR Interval:    QRS Duration: 92 QT Interval:  347 QTC Calculation: 439 R Axis:   51 Text Interpretation: Sinus rhythm Baseline  wander in lead(s) I Confirmed by Madalyn Rob [phone]) on 12/06/2019 1:02:46 PM   Radiology DG Chest Portable 1 View  Result Date: 12/06/2019 CLINICAL DATA:  Cough shortness of breath and COVID. EXAM: PORTABLE CHEST 1 VIEW COMPARISON:  05/24/2015 FINDINGS: The cardiomediastinal silhouette is unremarkable. Mild hazy opacities within both LOWER lungs may represent infection/pneumonia or atelectasis. No pleural effusion, consolidation, mass or pneumothorax. No acute bony abnormalities are present. IMPRESSION: Mild hazy opacities within both LOWER lungs which may represent infection/pneumonia or atelectasis. Electronically Signed   By: Margarette Canada M.D.   On: 12/06/2019 11:56    Procedures Procedures (including critical care time)  Medications Ordered in ED Medications - No data to display  ED Course  I have reviewed the triage vital signs and the nursing notes.  Pertinent labs & imaging results that were available during my care of the patient were reviewed by me and considered in my medical decision making (see chart for details).    MDM Rules/Calculators/A&P                          Patient brought to the ED for shortness of breath context of COVID-19 infection.  Given her initial tachycardia and mild tachypnea, initially there was suspicion for possible pulmonary embolism.  However, she is adamant that her shortness of breath symptoms or progressive in onset rather than acute.  She denies any hemoptysis.  While she endorses increased work of breathing, plain films are personally reviewed and demonstrate multifocal pneumonia in the context of COVID-19.  EKG is reviewed and is sinus tachycardia without S1Q3T3 or other EKG changes.  Initial troponin obtained and within  normal limits.  Laboratory work-up largely reassuring aside from mild hypocalcemia to 7.9.  Transaminitis consistent with COVID-19.  Fetal heart tones obtained.  She denies any abdominal pain or vaginal bleeding.  See note by RN.    I discussed case with Dr. Roslynn Amble who personally evaluated patient.  Agrees that this is likely shortness of breath in the setting of COVID-19 infection.  Will refer to [facility].  She will call them tomorrow to see if she is eligible candidate given her pregnancy.  I have also messaged them directly.  I would also like for her to follow-up with her OB/GYN for ongoing evaluation and management.  Macrobid for UTI. Complaining of dysuria.    Strict ED return precautions discussed.  All of the evaluation and work-up results were discussed with the patient and any family at bedside.  Patient and/or family were informed that while patient is appropriate for discharge at this time, some medical emergencies may only develop or become detectable after a period of time.  I specifically instructed patient and/or family to return to return to the ED or seek immediate medical attention for any new or worsening symptoms.  They were provided opportunity to ask any additional questions and have none at this time.  Prior to discharge patient is feeling well, agreeable with plan for discharge home.  They have expressed understanding of verbal discharge instructions as well as return precautions and are agreeable to the plan.   [place] was evaluated in Emergency Department on 12/06/2019 for the symptoms described in the history of present illness. She was evaluated in the context of the global COVID-19 pandemic, which necessitated consideration that the patient might be at risk for infection with the SARS-CoV-2 virus that causes COVID-19. Institutional protocols and algorithms that pertain  to the evaluation of patients at risk for COVID-19 are in a state of rapid change  based on information released by regulatory bodies including the CDC and federal and state organizations. These policies and algorithms were followed during the patient's care in the ED.  Final Clinical Impression(s) / ED Diagnoses Final diagnoses:  Pneumonia due to COVID-19 virus    Rx / DC Orders ED Discharge Orders         Ordered    nitrofurantoin, macrocrystal-monohydrate, (MACROBID) 100 MG capsule  2 times daily        12/06/19 1348           Corena Herter, PA-C 12/06/19 1346    Corena Herter, PA-C 12/06/19 1348    Lucrezia Starch, MD 12/07/19 1359

## 2019-12-06 NOTE — Discharge Instructions (Signed)

## 2019-12-06 NOTE — Progress Notes (Signed)
°  Diagnosis: COVID-19 ° °Physician: Wright ° ° °Procedure: [facility] Med: casirivimab\imdevimab infusion - Provided patient with casirivimab\imdevimab fact sheet for patients, parents and caregivers prior to infusion. ° °Complications: No immediate complications noted. ° °Discharge: Discharged home  ° °Fabian Coca S °12/06/2019 ° ° °

## 2019-12-06 NOTE — ED Triage Notes (Addendum)
Pt tested positive for COVID on Tuesday, pt is not vaccinated. Pt presents cough with shortness of breath onset Wednesday. Pt is [redacted] weeks pregnant.  Pt also reports burning with urination.

## 2019-12-06 NOTE — Progress Notes (Signed)
  Diagnosis: COVID-19  Physician: Dr. Wright   Procedure: [facility] Infusion Clinic Med: casirivimab\imdevimab infusion - Provided patient with casirivimab\imdevimab fact sheet for patients, parents and caregivers prior to infusion.  Complications: No immediate complications noted.  Discharge: Discharged home   Elizabeth Hanton  [name] 12/06/2019   

## 2019-12-06 NOTE — ED Notes (Signed)
Per pt unable to provide urine collection at this time. 

## 2019-12-06 NOTE — ED Notes (Signed)
Pt ambulated in room with PO-o2 range 98-96%, HR 111-119. Pts coughing increased, denies increased shob, denies dizziness. No s/s of distress other than increased coughing

## 2019-12-06 NOTE — Telephone Encounter (Signed)
Called to Discuss with patient about Covid symptoms and the use of the monoclonal antibody infusion for those with mild to moderate Covid symptoms and at a high risk of hospitalization.     Pt appears to qualify for this infusion due to co-morbid conditions and/or a member of an at-risk group in accordance with the FDA Emergency Use Authorization.    Sx started 7-days ago  Qualifiers include pregnancy, gestational diabetes, high SVI.   Her husband is sick as well.   Rexene Alberts, MSN, NP-C [facility] Infectious Disease [facility]  [email or web address] Pager: [phone] Office: [phone] [facility] Main Line: [phone]

## 2019-12-06 NOTE — ED Notes (Signed)
FHR obtained, fetal movement detected during exam.

## 2019-12-06 NOTE — ED Notes (Signed)
Patient denies pain and is resting comfortably.  

## 2019-12-06 NOTE — ED Notes (Signed)
Pt discharged to home. Discharge instructions have been discussed with patient and/or family members. Pt verbally acknowledges understanding d/c instructions, and endorses comprehension to checkout at registration before leaving.  °

## 2019-12-09 ENCOUNTER — Emergency Department (HOSPITAL_BASED_OUTPATIENT_CLINIC_OR_DEPARTMENT_OTHER): Payer: HRSA Program

## 2019-12-09 ENCOUNTER — Encounter (HOSPITAL_BASED_OUTPATIENT_CLINIC_OR_DEPARTMENT_OTHER): Payer: Self-pay | Admitting: Emergency Medicine

## 2019-12-09 ENCOUNTER — Other Ambulatory Visit: Payer: Self-pay

## 2019-12-09 ENCOUNTER — Emergency Department (HOSPITAL_BASED_OUTPATIENT_CLINIC_OR_DEPARTMENT_OTHER)
Admission: EM | Admit: 2019-12-09 | Discharge: 2019-12-09 | Disposition: A | Discharge status: Home / Self Care | Payer: HRSA Program | Attending: Emergency Medicine | Admitting: Emergency Medicine

## 2019-12-09 DIAGNOSIS — Z3A31 31 weeks gestation of pregnancy: Secondary | ICD-10-CM | POA: Diagnosis not present

## 2019-12-09 DIAGNOSIS — U071 COVID-19: Secondary | ICD-10-CM | POA: Diagnosis not present

## 2019-12-09 DIAGNOSIS — O99513 Diseases of the respiratory system complicating pregnancy, third trimester: Secondary | ICD-10-CM | POA: Diagnosis present

## 2019-12-09 DIAGNOSIS — Z3493 Encounter for supervision of normal pregnancy, unspecified, third trimester: Secondary | ICD-10-CM

## 2019-12-09 DIAGNOSIS — J1282 Pneumonia due to coronavirus disease 2019: Secondary | ICD-10-CM | POA: Diagnosis not present

## 2019-12-09 LAB — FIBRINOGEN: Fibrinogen: 597 mg/dL — ABNORMAL HIGH (ref 210–475)

## 2019-12-09 LAB — COMPREHENSIVE METABOLIC PANEL
ALT: 27 U/L (ref 0–44)
AST: 57 U/L — ABNORMAL HIGH (ref 15–41)
Albumin: 2.6 g/dL — ABNORMAL LOW (ref 3.5–5.0)
Alkaline Phosphatase: 169 U/L — ABNORMAL HIGH (ref 38–126)
Anion gap: 12 (ref 5–15)
BUN: 7 mg/dL (ref 6–20)
CO2: 18 mmol/L — ABNORMAL LOW (ref 22–32)
Calcium: 8.2 mg/dL — ABNORMAL LOW (ref 8.9–10.3)
Chloride: 105 mmol/L (ref 98–111)
Creatinine, Ser: 0.48 mg/dL (ref 0.44–1.00)
GFR calc Af Amer: >60 mL/min (ref >60)
GFR calc non Af Amer: >60 mL/min (ref >60)
Glucose, Bld: 79 mg/dL (ref 70–99)
Potassium: 3.7 mmol/L (ref 3.5–5.1)
Sodium: 135 mmol/L (ref 135–145)
Total Bilirubin: 0.7 mg/dL (ref 0.3–1.2)
Total Protein: 6.4 g/dL — ABNORMAL LOW (ref 6.5–8.1)

## 2019-12-09 LAB — FERRITIN: Ferritin: 169 ng/mL (ref 11–307)

## 2019-12-09 LAB — CBC WITH DIFFERENTIAL/PLATELET
Abs Immature Granulocytes: 0.07 10*3/uL (ref 0.00–0.07)
Basophils Absolute: 0 10*3/uL (ref 0.0–0.1)
Basophils Relative: 0 %
Eosinophils Absolute: 0 10*3/uL (ref 0.0–0.5)
Eosinophils Relative: 0 %
HCT: 41.4 % (ref 36.0–46.0)
Hemoglobin: 13.8 g/dL (ref 12.0–15.0)
Immature Granulocytes: 1 %
Lymphocytes Relative: 17 %
Lymphs Abs: 0.8 10*3/uL (ref 0.7–4.0)
MCH: 28.9 pg (ref 26.0–34.0)
MCHC: 33.3 g/dL (ref 30.0–36.0)
MCV: 86.6 fL (ref 80.0–100.0)
Monocytes Absolute: 0.2 10*3/uL (ref 0.1–1.0)
Monocytes Relative: 5 %
Neutro Abs: 3.7 10*3/uL (ref 1.7–7.7)
Neutrophils Relative %: 77 %
Platelets: 280 10*3/uL (ref 150–400)
RBC: 4.78 MIL/uL (ref 3.87–5.11)
RDW: 13.7 % (ref 11.5–15.5)
WBC: 4.9 10*3/uL (ref 4.0–10.5)
nRBC: 0 % (ref 0.0–0.2)

## 2019-12-09 LAB — C-REACTIVE PROTEIN: CRP: 2.6 mg/dL — ABNORMAL HIGH (ref <1.0)

## 2019-12-09 LAB — LACTATE DEHYDROGENASE: LDH: 348 U/L — ABNORMAL HIGH (ref 98–192)

## 2019-12-09 LAB — D-DIMER, QUANTITATIVE: D-Dimer, Quant: 1.58 ug/mL-FEU — ABNORMAL HIGH (ref 0.00–0.50)

## 2019-12-09 LAB — PROCALCITONIN: Procalcitonin: 0.31 ng/mL

## 2019-12-09 LAB — LACTIC ACID, PLASMA: Lactic Acid, Venous: 1.2 mmol/L (ref 0.5–1.9)

## 2019-12-09 LAB — TRIGLYCERIDES: Triglycerides: 519 mg/dL — ABNORMAL HIGH (ref <150)

## 2019-12-09 MED ORDER — DIPHENHYDRAMINE HCL 25 MG PO CAPS
25.0000 mg | ORAL_CAPSULE | Freq: Once | ORAL | Status: AC
  Administered 2019-12-09: 25 mg via ORAL
  Filled 2019-12-09: qty 1

## 2019-12-09 MED ORDER — IOHEXOL 350 MG/ML SOLN
100.0000 mL | Freq: Once | INTRAVENOUS | Status: AC | PRN
  Administered 2019-12-09: 75 mL via INTRAVENOUS

## 2019-12-09 MED ORDER — METHYLPREDNISOLONE SODIUM SUCC 125 MG IJ SOLR
125.0000 mg | Freq: Once | INTRAMUSCULAR | Status: AC
  Administered 2019-12-09: 125 mg via INTRAVENOUS
  Filled 2019-12-09: qty 2

## 2019-12-09 MED ORDER — ALBUTEROL SULFATE HFA 108 (90 BASE) MCG/ACT IN AERS
2.0000 | INHALATION_SPRAY | Freq: Once | RESPIRATORY_TRACT | Status: AC
  Administered 2019-12-09: 2 via RESPIRATORY_TRACT
  Filled 2019-12-09: qty 6.7

## 2019-12-09 MED ORDER — PREDNISONE 20 MG PO TABS: ORAL_TABLET | ORAL | 0 refills | Status: AC

## 2019-12-09 NOTE — ED Notes (Signed)
ED Provider at bedside. 

## 2019-12-09 NOTE — ED Triage Notes (Addendum)
Reports shortness of breath , covid + x 1 week. Chest pain when coughing. 30 wks preg. Reports feeling baby moving today.

## 2019-12-09 NOTE — ED Notes (Signed)
Patient transported to CT 

## 2019-12-09 NOTE — ED Provider Notes (Signed)
[facility] Provider Note   CSN: [id number] Arrival date & time: 12/09/19  1119     History Chief Complaint  Patient presents with  . Shortness of Breath    Covid +    Grecia Linwood Dibbles Rafael Quesada is a 34 y.o. female hx of gestational diabetes, recent Covid infection here presenting with shortness of breath and hypoxia.  Patient states that she was diagnosed with Covid on 9/21.  She then went to the ER on the 26 and was diagnosed with Covid pneumonia.  Patient had monoclonal antibody.  Patient states that she has persistent shortness of breath.  She has a pulse ox at home and apparently her oxygen dropped to 89%.  She states that she has persistent cough and low-grade temperature at home.  She states that she is concerned because of her hypoxia.  She states that she is [redacted] weeks pregnant currently.  She denies any vaginal bleeding and states that the baby is still moving.  The history is provided by the patient.       Past Medical History:  Diagnosis Date  . Gestational diabetes 12/09/2014   diet controlled    Patient Active Problem List   Diagnosis Date Noted  . Normal labor 09/06/2017  . History of gestational diabetes 02/04/2017    Past Surgical History:  Procedure Laterality Date  . HAND SURGERY       OB History    Gravida  2   Para  2   Term  2   Preterm  0   AB      Living  2     SAB      TAB      Ectopic      Multiple  0   Live Births  2           Family History  Problem Relation Age of Onset  . Hypertension Mother   . Hyperlipidemia Mother   . Diabetes Father     Social History   Tobacco Use  . Smoking status: Never Smoker  . Smokeless tobacco: Never Used  Vaping Use  . Vaping Use: Never used  Substance Use Topics  . Alcohol use: Yes    Comment: occasion  . Drug use: No    Home Medications Prior to Admission medications   Medication Sig Start Date End Date Taking? Authorizing Provider    ACCU-CHEK FASTCLIX LANCETS MISC 1 Units by Percutaneous route 4 (four) times daily. 07/08/17   Burleson, Rona Ravens, NP  Blood Glucose Monitoring Suppl (ACCU-CHEK GUIDE) w/Device KIT 1 Device by Does not apply route 4 (four) times daily. 07/08/17   Burleson, Rona Ravens, NP  glucose blood test strip Use as instructed 07/08/17   Burleson, Rona Ravens, NP  ibuprofen (ADVIL,MOTRIN) 600 MG tablet Take 1 tablet (600 mg total) by mouth every 6 (six) hours. 09/08/17   Keitha Butte, CNM  nitrofurantoin, macrocrystal-monohydrate, (MACROBID) 100 MG capsule Take 1 capsule (100 mg total) by mouth 2 (two) times daily. 12/06/19   Corena Herter, PA-C  Norethindrone Acetate-Ethinyl Estrad-FE (LOESTRIN 24 FE) 1-20 MG-MCG(24) tablet Take 1 tablet by mouth daily. 10/08/17   Starr Lake, CNM  Prenatal Vit-Fe Fumarate-FA (PRENATAL MULTIVITAMIN) TABS tablet Take 1 tablet by mouth daily at 12 noon. 09/08/17   Keitha Butte, CNM    Allergies    Patient has no known allergies.  Review of Systems   Review of Systems  Respiratory: Positive for cough.  All other systems reviewed and are negative.   Physical Exam Updated Vital Signs BP 106/77 (BP Location: Right Arm)   Pulse 78   Temp 97.8 F (36.6 C) (Oral)   Resp 19   SpO2 96%   Physical Exam Vitals and nursing note reviewed.  Constitutional:      Comments: Coughing, tachypneic   HENT:     Head: Normocephalic.     Mouth/Throat:     Mouth: Mucous membranes are moist.  Eyes:     Extraocular Movements: Extraocular movements intact.     Pupils: Pupils are equal, round, and reactive to light.  Cardiovascular:     Rate and Rhythm: Normal rate and regular rhythm.  Pulmonary:     Comments: Tachypneic, dry crackles bilateral bases  Abdominal:     Comments: Gravid uterus, nontender   Musculoskeletal:        General: Normal range of motion.     Cervical back: Normal range of motion and neck supple.  Skin:    General: Skin is warm.     Capillary  Refill: Capillary refill takes less than 2 seconds.  Neurological:     General: No focal deficit present.  Psychiatric:        Mood and Affect: Mood normal.        Behavior: Behavior normal.     ED Results / Procedures / Treatments   Labs (all labs ordered are listed, but only abnormal results are displayed) Labs Reviewed  CULTURE, BLOOD (ROUTINE X 2)  CULTURE, BLOOD (ROUTINE X 2)  LACTIC ACID, PLASMA  LACTIC ACID, PLASMA  CBC WITH DIFFERENTIAL/PLATELET  COMPREHENSIVE METABOLIC PANEL  D-DIMER, QUANTITATIVE (NOT AT [facility])  PROCALCITONIN  LACTATE DEHYDROGENASE  FERRITIN  TRIGLYCERIDES  FIBRINOGEN  C-REACTIVE PROTEIN    EKG None  Radiology No results found.  Procedures Procedures (including critical care time)  Medications Ordered in ED Medications  albuterol (VENTOLIN HFA) 108 (90 Base) MCG/ACT inhaler 2 puff (has no administration in time range)    ED Course  I have reviewed the triage vital signs and the nursing notes.  Pertinent labs & imaging results that were available during my care of the patient were reviewed by me and considered in my medical decision making (see chart for details).    MDM Rules/Calculators/A&P                          Ellery Plunk Catalia Massett is a 34 y.o. female [redacted] weeks pregnant here with shortness of breath.  Patient was Covid positive on 9/21.  Patient received antibody infusion on 9/26.  Patient apparently was hypoxic this morning.  Plan to get Covid preadmission labs and chest x-ray.  7:37 PM Covid preadmission labs show elevated D-dimer.  CTA did not show any PE.  She states that she is breathing better.  She was able to ambulate oxygen only dropped to 91 to 92%.  She was slightly tachypneic initially and respiratory rate went down to upper 20s.  I think at this point she is stable to go home.  I updated her OB doctor at [facility], Dr. Micah Noel.  He states that there is no contraindication for steroids at this point.  We will give a  dose of Solu-Medrol.  Will discharge patient home with a course of prednisone.  Patient does have gestational diabetes and its been followed with [facility] as well. If her blood sugar remains elevated she will need to call her  doctor.  Final Clinical Impression(s) / ED Diagnoses Final diagnoses:  None    Rx / DC Orders ED Discharge Orders    None       Drenda Freeze, MD 12/09/19 1940

## 2019-12-09 NOTE — Discharge Instructions (Signed)
Your shortness of breath is from your Covid.  You do not have any blood clot right now.  Please take prednisone as prescribed.  Your blood sugar may be elevated so please check it often.  If your blood sugar is elevated you may need to talk to your gestational diabetic specialist at [facility]  You need to follow-up with your OB doctor  Return to ER if you have worsening shortness of breath, trouble breathing, fever

## 2019-12-09 NOTE — ED Notes (Signed)
RT to ambulate pt with pulse ox, pt @ 98-97% while in bed, pt destat to 90-91% and becomes visibly tachypneic & coughing during ambulation

## 2019-12-09 NOTE — ED Notes (Signed)
RT at bedside.

## 2019-12-14 LAB — CULTURE, BLOOD (ROUTINE X 2)
Culture: NO GROWTH
Special Requests: ADEQUATE

## 2020-02-04 IMAGING — US US MFM OB FOLLOW-UP
1 series · 14 of 28 positions shown · non-contrast
Comparison: none

[Series 1: us mfm ob follow-up · 37 acquisitions, 14 frames shown]
[im 2/37]
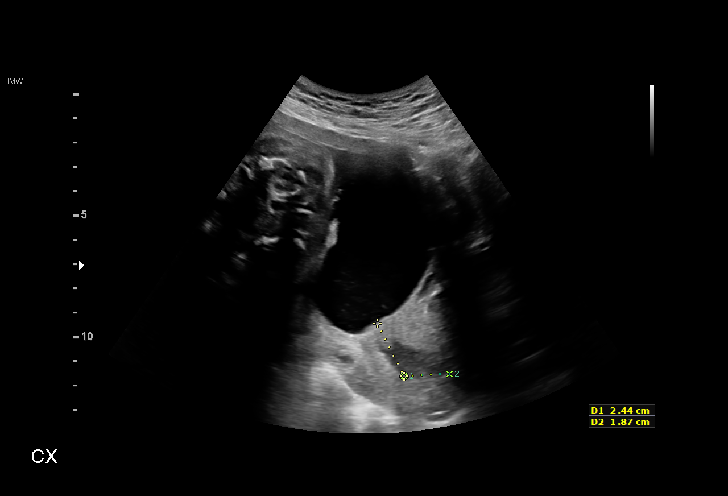
[im 5/37]
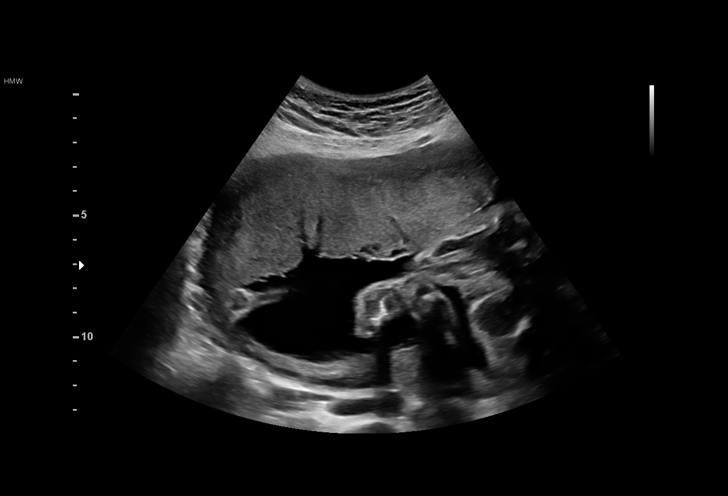
[im 7/37]
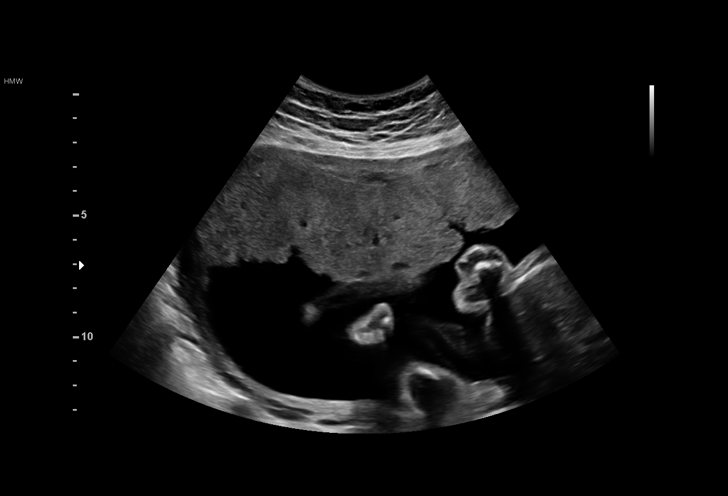
[im 10/37]
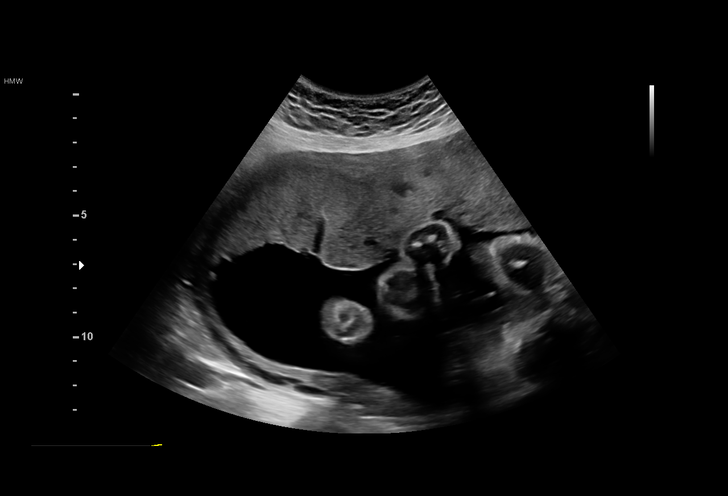
[im 13/37]
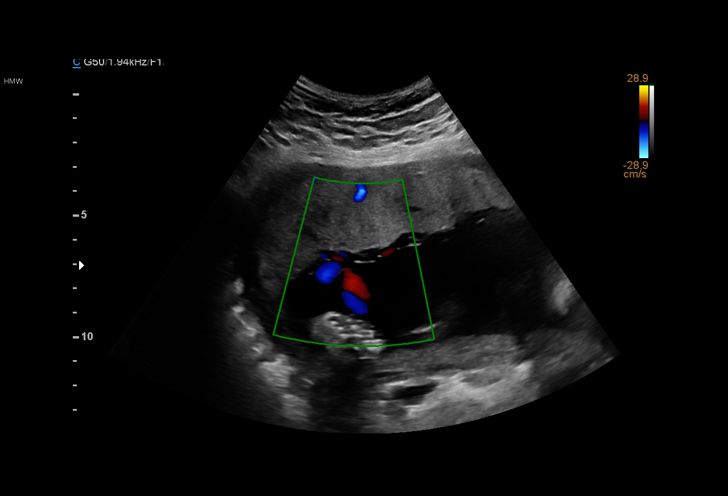
[im 15/37]
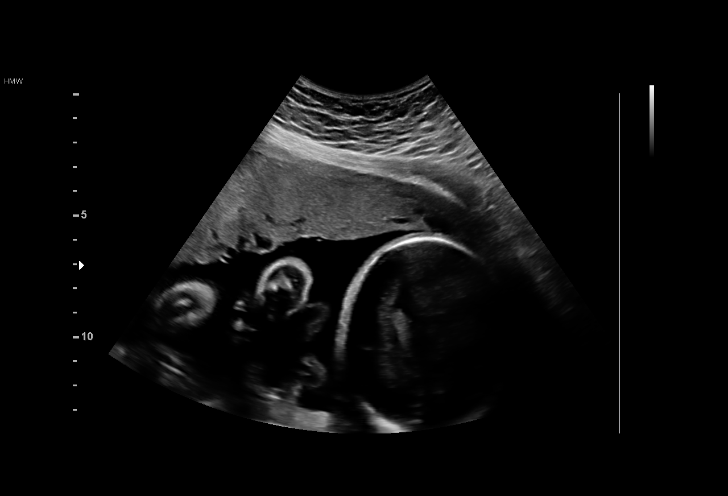
[im 18/37]
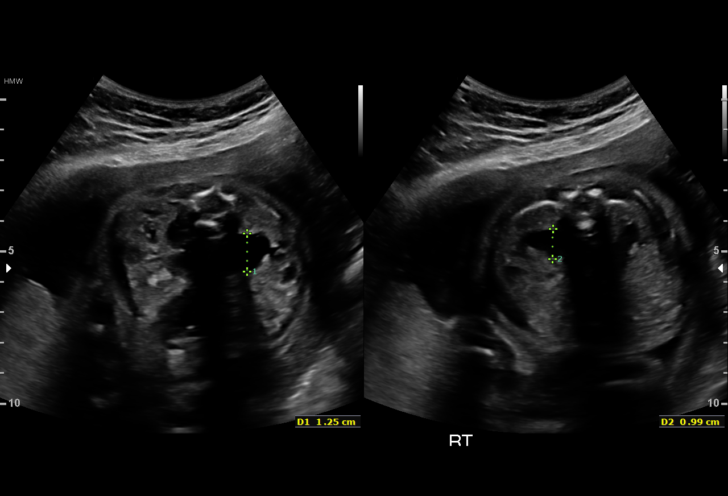
[im 21/37]
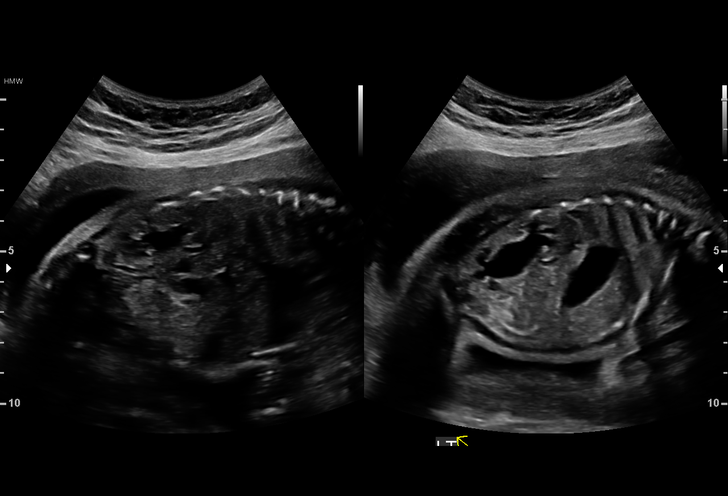
[im 23/37]
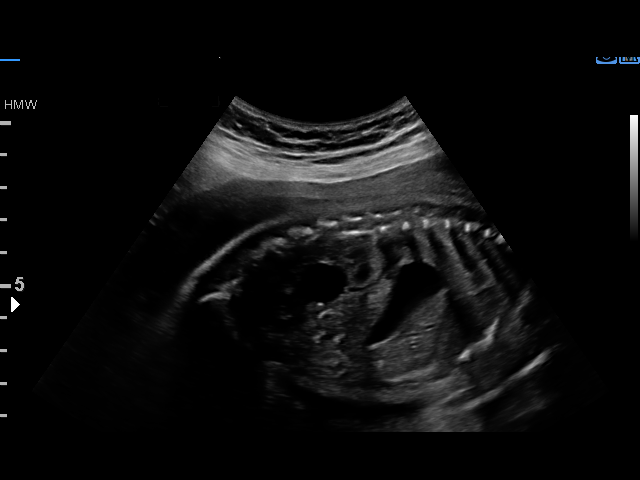
[im 26/37]
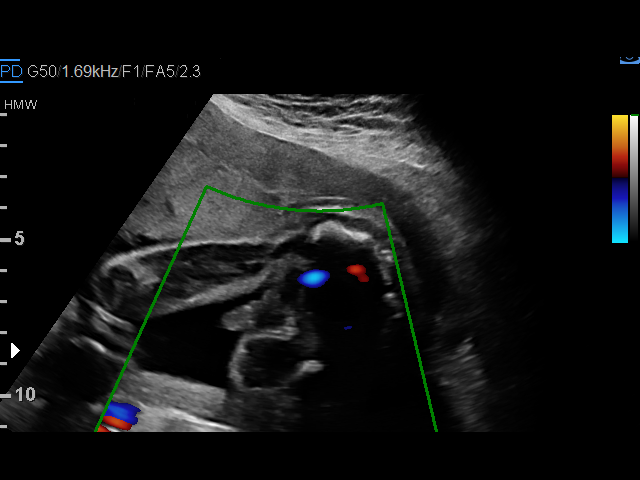
[im 29/37]
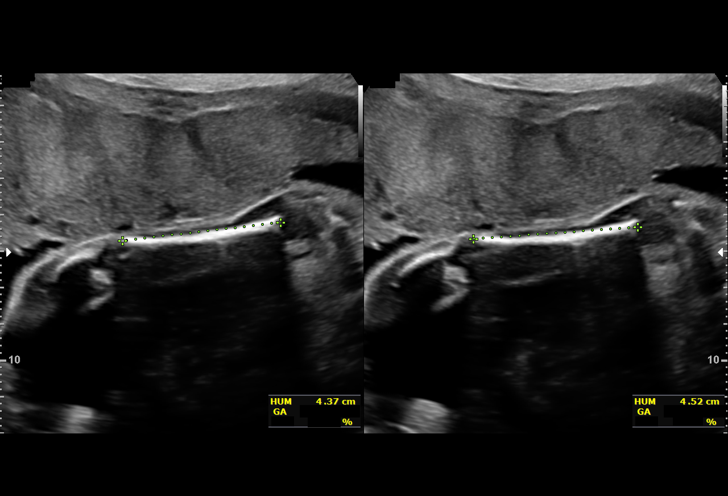
[im 31/37]
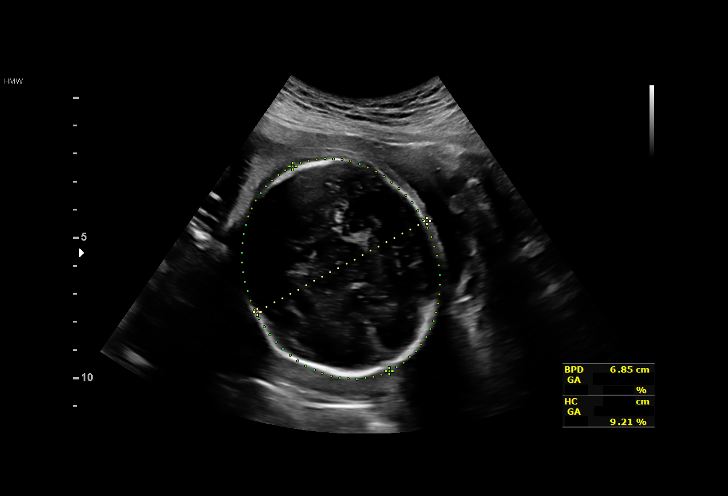
[im 34/37]
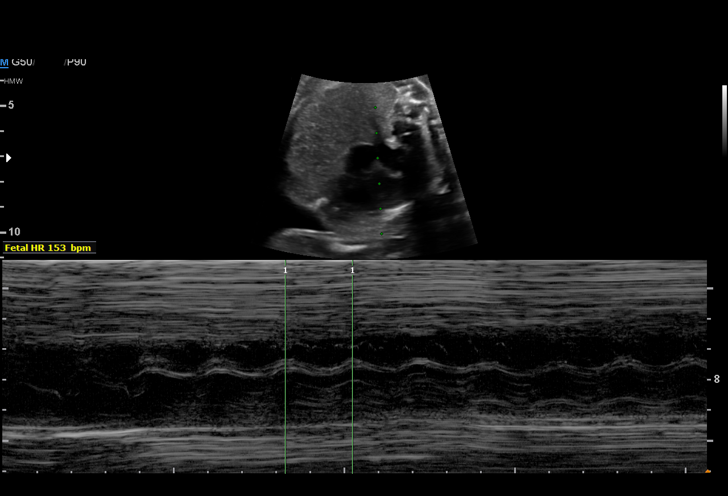
[im 37/37]
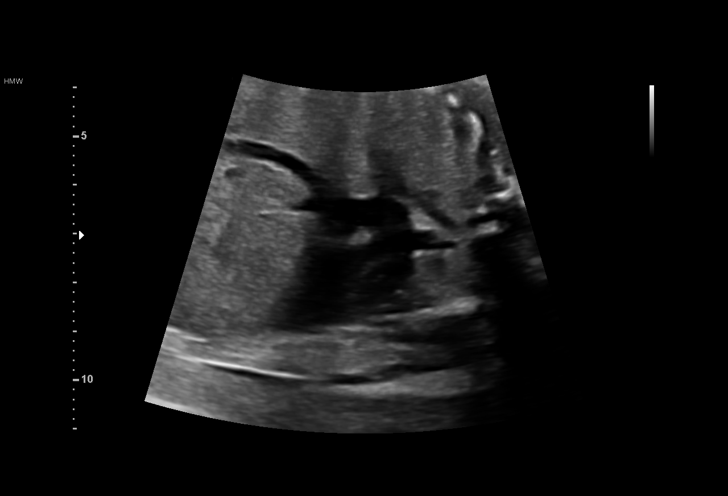

[14 of 28 positions shown; findings below may reference images not displayed]

[facility]

OB/Gyn Clinic

1  SERGIO HENRIQUE LAIZ         [id number]      [id number]     [id number]
Indications

26 weeks gestation of pregnancy
Poor obstetric history: Previous gestational
diabetes
Encounter for other antenatal screening
follow-up
Fetal renal anomaly, unspecified fetus
Fetal Evaluation

Num Of Fetuses:     1
Fetal Heart         153
Rate(bpm):
Cardiac Activity:   Observed
Presentation:       Cephalic
Placenta:           Anterior, above cervical os
P. Cord Insertion:  Visualized, central

Amniotic Fluid
AFI FV:      Subjectively within normal limits

Largest Pocket(cm)
4.7
Biometry

BPD:      68.2  mm     G. Age:  27w 3d         82  %    CI:        82.28   %    70 - 86
FL/HC:      20.3   %    18.6 -
HC:      237.2  mm     G. Age:  25w 6d         16  %    HC/AC:      1.05        1.04 -
AC:      226.8  mm     G. Age:  27w 1d         70  %    FL/BPD:     70.7   %    71 - 87
FL:       48.2  mm     G. Age:  26w 1d         37  %    FL/AC:      21.3   %    20 - 24
HUM:      44.4  mm     G. Age:  26w 2d         51  %

Est. FW:     964  gm      2 lb 2 oz     64  %
Gestational Age

LMP:           28w 5d        Date:  11/24/16                 EDD:   08/31/17
U/S Today:     26w 5d                                        EDD:   09/14/17
Best:          26w 1d     Det. By:  U/S C R L  (02/04/17)    EDD:   09/18/17
Anatomy

Cranium:               Appears normal         Aortic Arch:            Previously seen
Cavum:                 Previously seen        Ductal Arch:            Previously seen
Ventricles:            Appears normal         Diaphragm:              Previously seen
Choroid Plexus:        Previously seen        Stomach:                Appears normal, left
sided
Cerebellum:            Previously seen        Abdomen:                Previously seen
Posterior Fossa:       Previously seen        Abdominal Wall:         Appears nml (cord
insert, abd wall)
Nuchal Fold:           Previously seen        Cord Vessels:           Previously seen
Face:                  Orbits nl; profile not Kidneys:                Bilat pyel, Rt
well visualized
10mm, Lt 12.5mm
Lips:                  Previously seen        Bladder:                Appears normal
Heart:                 Previously seen        Spine:                  Previously seen
RVOT:                  Appears normal         Upper Extremities:      Previously seen
LVOT:                  Appears normal         Lower Extremities:      Previously seen

Other:  Parents do not wish to know sex of fetus. Fetus appears to be a male.
Heels visualized previously. Nasal bone visualized previously.
Cervix Uterus Adnexa

Cervix
Length:            3.8  cm.
Normal appearance by transabdominal scan.

Uterus
No abnormality visualized.

Left Ovary
No adnexal mass visualized.

Right Ovary
No adnexal mass visualized.

Cul De Sac:   No free fluid seen.

Adnexa:       No abnormality visualized.
Comments

Bilateral renal collecting system dilation was seen (Right 10
mm, Left 12.5 mm).  Although renal collecting system dilation
may be associated with an increased risk of Down syndrome,
this risk is felt to be minimal, especially when it is seen as an
isolated finding.  However, with this level of urinary tract
dilation there is a risk of congenital anomaly of the kidneys
and urinary tract.  A referral to Pediatric Urology should be
considered
Impression

Single living intrauterine pregnancy at 26 weeks 1 day.
Appropriate interval fetal growth (64%).
Normal amniotic fluid volume.
Normal interval fetal anatomy.
Bilateral urinary tract dilation--right 10 mm, left 12.5 mm.
Recommendations

Recommend follow-up ultrasound examination in 6 weeks to
reassess fetal renal anatomy.

## 2020-10-27 ENCOUNTER — Other Ambulatory Visit: Payer: Self-pay | Admitting: Nurse Practitioner

## 2020-10-27 ENCOUNTER — Ambulatory Visit
Admission: RE | Admit: 2020-10-27 | Discharge: 2020-10-27 | Disposition: A | Discharge status: Home / Self Care | Payer: No Typology Code available for payment source | Source: Ambulatory Visit | Attending: Nurse Practitioner | Admitting: Nurse Practitioner

## 2020-10-27 DIAGNOSIS — R059 Cough, unspecified: Secondary | ICD-10-CM
